# Patient Record
Sex: Female | Born: 1935 | Race: White | Hispanic: No
Health system: Southern US, Community
[De-identification: ages and names within clinical notes are randomized; demographics above are authoritative.]

## PROBLEM LIST (undated history)

## (undated) ENCOUNTER — Emergency Department (HOSPITAL_COMMUNITY): Payer: Self-pay

## (undated) DIAGNOSIS — N200 Calculus of kidney: Secondary | ICD-10-CM

## (undated) HISTORY — PX: TONSILLECTOMY: SUR1361

## (undated) HISTORY — PX: OTHER SURGICAL HISTORY: SHX169

---

## 1998-01-27 ENCOUNTER — Other Ambulatory Visit: Admission: RE | Admit: 1998-01-27 | Discharge: 1998-01-27 | Payer: Self-pay | Admitting: Family Medicine

## 1999-01-20 ENCOUNTER — Other Ambulatory Visit: Admission: RE | Admit: 1999-01-20 | Discharge: 1999-01-20 | Payer: Self-pay | Admitting: Family Medicine

## 1999-08-14 ENCOUNTER — Encounter: Admission: RE | Admit: 1999-08-14 | Discharge: 1999-08-14 | Payer: Self-pay | Admitting: Family Medicine

## 1999-08-14 ENCOUNTER — Encounter: Payer: Self-pay | Admitting: Family Medicine

## 2000-01-24 ENCOUNTER — Other Ambulatory Visit: Admission: RE | Admit: 2000-01-24 | Discharge: 2000-01-24 | Payer: Self-pay | Admitting: Family Medicine

## 2000-08-26 ENCOUNTER — Encounter: Payer: Self-pay | Admitting: Family Medicine

## 2000-08-26 ENCOUNTER — Encounter: Admission: RE | Admit: 2000-08-26 | Discharge: 2000-08-26 | Payer: Self-pay | Admitting: Family Medicine

## 2001-09-10 ENCOUNTER — Encounter: Payer: Self-pay | Admitting: Family Medicine

## 2001-09-10 ENCOUNTER — Encounter: Admission: RE | Admit: 2001-09-10 | Discharge: 2001-09-10 | Payer: Self-pay | Admitting: Family Medicine

## 2002-09-24 ENCOUNTER — Encounter: Admission: RE | Admit: 2002-09-24 | Discharge: 2002-09-24 | Payer: Self-pay | Admitting: Orthopedic Surgery

## 2002-09-24 ENCOUNTER — Encounter: Payer: Self-pay | Admitting: Orthopedic Surgery

## 2004-01-12 ENCOUNTER — Ambulatory Visit (HOSPITAL_COMMUNITY): Admission: RE | Admit: 2004-01-12 | Discharge: 2004-01-12 | Payer: Self-pay | Admitting: Family Medicine

## 2005-02-15 ENCOUNTER — Ambulatory Visit (HOSPITAL_COMMUNITY): Admission: RE | Admit: 2005-02-15 | Discharge: 2005-02-15 | Payer: Self-pay | Admitting: Family Medicine

## 2006-02-21 ENCOUNTER — Ambulatory Visit (HOSPITAL_COMMUNITY): Admission: RE | Admit: 2006-02-21 | Discharge: 2006-02-21 | Payer: Self-pay | Admitting: Family Medicine

## 2007-04-23 ENCOUNTER — Ambulatory Visit (HOSPITAL_COMMUNITY): Admission: RE | Admit: 2007-04-23 | Discharge: 2007-04-23 | Payer: Self-pay | Admitting: Family Medicine

## 2008-04-28 ENCOUNTER — Ambulatory Visit (HOSPITAL_COMMUNITY): Admission: RE | Admit: 2008-04-28 | Discharge: 2008-04-28 | Payer: Self-pay | Admitting: Family Medicine

## 2009-05-30 ENCOUNTER — Ambulatory Visit (HOSPITAL_COMMUNITY): Admission: RE | Admit: 2009-05-30 | Discharge: 2009-05-30 | Payer: Self-pay | Admitting: Family Medicine

## 2010-06-08 ENCOUNTER — Ambulatory Visit (HOSPITAL_COMMUNITY): Admission: RE | Admit: 2010-06-08 | Discharge: 2010-06-08 | Payer: Self-pay | Admitting: Family Medicine

## 2010-08-27 ENCOUNTER — Encounter: Payer: Self-pay | Admitting: Family Medicine

## 2011-05-23 ENCOUNTER — Other Ambulatory Visit (HOSPITAL_COMMUNITY): Payer: Self-pay | Admitting: Family Medicine

## 2011-05-23 DIAGNOSIS — Z1231 Encounter for screening mammogram for malignant neoplasm of breast: Secondary | ICD-10-CM

## 2011-06-13 ENCOUNTER — Ambulatory Visit (HOSPITAL_COMMUNITY)
Admission: RE | Admit: 2011-06-13 | Discharge: 2011-06-13 | Disposition: A | Discharge status: Home / Self Care | Payer: Medicare Other | Source: Ambulatory Visit | Attending: Family Medicine | Admitting: Family Medicine

## 2011-06-13 DIAGNOSIS — Z1231 Encounter for screening mammogram for malignant neoplasm of breast: Secondary | ICD-10-CM | POA: Insufficient documentation

## 2011-06-25 ENCOUNTER — Other Ambulatory Visit: Payer: Self-pay | Admitting: Family Medicine

## 2011-06-25 DIAGNOSIS — R928 Other abnormal and inconclusive findings on diagnostic imaging of breast: Secondary | ICD-10-CM

## 2011-07-09 ENCOUNTER — Ambulatory Visit
Admission: RE | Admit: 2011-07-09 | Discharge: 2011-07-09 | Disposition: A | Discharge status: Home / Self Care | Payer: Medicare Other | Source: Ambulatory Visit | Attending: Family Medicine | Admitting: Family Medicine

## 2011-07-09 DIAGNOSIS — R928 Other abnormal and inconclusive findings on diagnostic imaging of breast: Secondary | ICD-10-CM

## 2012-06-19 ENCOUNTER — Other Ambulatory Visit (HOSPITAL_COMMUNITY): Payer: Self-pay | Admitting: Family Medicine

## 2012-06-19 DIAGNOSIS — Z1231 Encounter for screening mammogram for malignant neoplasm of breast: Secondary | ICD-10-CM

## 2012-06-20 ENCOUNTER — Other Ambulatory Visit (HOSPITAL_COMMUNITY): Payer: Self-pay | Admitting: Family Medicine

## 2012-06-20 DIAGNOSIS — M810 Age-related osteoporosis without current pathological fracture: Secondary | ICD-10-CM

## 2012-07-11 ENCOUNTER — Ambulatory Visit (HOSPITAL_COMMUNITY)
Admission: RE | Admit: 2012-07-11 | Discharge: 2012-07-11 | Disposition: A | Discharge status: Home / Self Care | Payer: Medicare Other | Source: Ambulatory Visit | Attending: Family Medicine | Admitting: Family Medicine

## 2012-07-11 DIAGNOSIS — Z13820 Encounter for screening for osteoporosis: Secondary | ICD-10-CM | POA: Insufficient documentation

## 2012-07-11 DIAGNOSIS — Z1231 Encounter for screening mammogram for malignant neoplasm of breast: Secondary | ICD-10-CM

## 2012-07-11 DIAGNOSIS — M810 Age-related osteoporosis without current pathological fracture: Secondary | ICD-10-CM

## 2012-07-11 DIAGNOSIS — Z780 Asymptomatic menopausal state: Secondary | ICD-10-CM | POA: Insufficient documentation

## 2013-02-11 ENCOUNTER — Emergency Department (HOSPITAL_COMMUNITY): Payer: Medicare Other

## 2013-02-11 ENCOUNTER — Encounter (HOSPITAL_COMMUNITY): Payer: Self-pay | Admitting: Emergency Medicine

## 2013-02-11 ENCOUNTER — Emergency Department (HOSPITAL_COMMUNITY)
Admission: EM | Admit: 2013-02-11 | Discharge: 2013-02-11 | Disposition: A | Discharge status: Home / Self Care | Payer: Medicare Other | Attending: Emergency Medicine | Admitting: Emergency Medicine

## 2013-02-11 DIAGNOSIS — R109 Unspecified abdominal pain: Secondary | ICD-10-CM

## 2013-02-11 DIAGNOSIS — R1033 Periumbilical pain: Secondary | ICD-10-CM | POA: Insufficient documentation

## 2013-02-11 DIAGNOSIS — R000 Tachycardia, unspecified: Secondary | ICD-10-CM | POA: Insufficient documentation

## 2013-02-11 DIAGNOSIS — Z87442 Personal history of urinary calculi: Secondary | ICD-10-CM | POA: Insufficient documentation

## 2013-02-11 HISTORY — DX: Calculus of kidney: N20.0

## 2013-02-11 LAB — URINALYSIS, ROUTINE W REFLEX MICROSCOPIC
Bilirubin Urine: NEGATIVE
Glucose, UA: NEGATIVE mg/dL
Hgb urine dipstick: NEGATIVE
Ketones, ur: NEGATIVE mg/dL
Nitrite: NEGATIVE
Protein, ur: NEGATIVE mg/dL
Specific Gravity, Urine: 1.019 (ref 1.005–1.030)
pH: 7 (ref 5.0–8.0)

## 2013-02-11 LAB — CBC WITH DIFFERENTIAL/PLATELET
Basophils Relative: 0 % (ref 0–1)
Eosinophils Absolute: 0 10*3/uL (ref 0.0–0.7)
Lymphs Abs: 0.9 10*3/uL (ref 0.7–4.0)
MCV: 92.1 fL (ref 78.0–100.0)
Monocytes Relative: 5 % (ref 3–12)
Neutrophils Relative %: 86 % — ABNORMAL HIGH (ref 43–77)
Platelets: 187 10*3/uL (ref 150–400)
WBC: 9.6 10*3/uL (ref 4.0–10.5)

## 2013-02-11 LAB — URINE MICROSCOPIC-ADD ON

## 2013-02-11 LAB — COMPREHENSIVE METABOLIC PANEL
CO2: 26 mEq/L (ref 19–32)
Calcium: 9.4 mg/dL (ref 8.4–10.5)
Creatinine, Ser: 0.69 mg/dL (ref 0.50–1.10)
GFR calc non Af Amer: 82 mL/min — ABNORMAL LOW (ref >90)
Glucose, Bld: 203 mg/dL — ABNORMAL HIGH (ref 70–99)
Potassium: 3.9 mEq/L (ref 3.5–5.1)
Total Protein: 7.3 g/dL (ref 6.0–8.3)

## 2013-02-11 MED ORDER — ONDANSETRON HCL 4 MG/2ML IJ SOLN
4.0000 mg | Freq: Once | INTRAMUSCULAR | Status: AC
  Administered 2013-02-11: 4 mg via INTRAVENOUS
  Filled 2013-02-11: qty 2

## 2013-02-11 MED ORDER — POLYETHYLENE GLYCOL 3350 17 GM/SCOOP PO POWD: 17.0000 g | Freq: Every day | ORAL | Status: DC

## 2013-02-11 MED ORDER — IOHEXOL 300 MG/ML  SOLN
50.0000 mL | Freq: Once | INTRAMUSCULAR | Status: AC | PRN
  Administered 2013-02-11: 50 mL via ORAL

## 2013-02-11 MED ORDER — IOHEXOL 300 MG/ML  SOLN
100.0000 mL | Freq: Once | INTRAMUSCULAR | Status: AC | PRN
  Administered 2013-02-11: 100 mL via INTRAVENOUS

## 2013-02-11 MED ORDER — OXYCODONE-ACETAMINOPHEN 5-325 MG PO TABS: 1.0000 | ORAL_TABLET | Freq: Four times a day (QID) | ORAL | Status: DC | PRN

## 2013-02-11 MED ORDER — MORPHINE SULFATE 4 MG/ML IJ SOLN
4.0000 mg | Freq: Once | INTRAMUSCULAR | Status: AC
  Administered 2013-02-11: 4 mg via INTRAVENOUS
  Filled 2013-02-11: qty 1

## 2013-02-11 NOTE — ED Notes (Signed)
Pt reports abdominal pain since 1930 last night.  Pt denies n/v/d. Pt stated she ate fried okra and cantaloupe and she hasn't had these regularly before.  Pt stated pain intermittent, 2/10 at this time.  Pt ambulatory in ED, NAD noted at this time.

## 2013-02-11 NOTE — ED Notes (Signed)
MD at bedside speaking to pt and family member 

## 2013-02-11 NOTE — ED Provider Notes (Signed)
History    CSN: [id number] Arrival date & time 02/11/13  7829  First MD Initiated Contact with Patient 02/11/13 [phone]     Chief Complaint  Patient presents with  . Abdominal Pain   (Consider location/radiation/quality/duration/timing/severity/associated sxs/prior Treatment) HPI Comments: Patient states around 4:30 yesterday she started to have some mild periumbilical abdominal pain. Her around 7 PM the pain became severe a 10 out of 10. It persisted throughout the night and she was unable to get comfortable. She had 2 normal bowel movements during the pain without improvement.  She denies any nausea or vomiting. No recent medication changes and denies any urinary changes or pain with urination. She had a kidney stone in the past but states this feels nothing like a kidney stone.  Currently pain is 2/10 and she is feeling much better.  Patient is a 78 y.o. female presenting with abdominal pain. The history is provided by the patient.  Abdominal Pain This is a new problem. The current episode started 12 to 24 hours ago. The problem occurs constantly. The problem has been gradually improving. Associated symptoms include abdominal pain. Pertinent negatives include no shortness of breath. The symptoms are aggravated by eating. Nothing relieves the symptoms. She has tried acetaminophen for the symptoms. The treatment provided no relief.   Past Medical History  Diagnosis Date  . Kidney stones    Past Surgical History  Procedure Laterality Date  . Kidney stone removal     History reviewed. No pertinent family history. History  Substance Use Topics  . Smoking status: Never Smoker   . Smokeless tobacco: Never Used  . Alcohol Use: No   OB History   Grav Para Term Preterm Abortions TAB SAB Ect Mult Living                 Review of Systems  Constitutional: Negative for fever.  Respiratory: Negative for cough, shortness of breath and wheezing.   Cardiovascular: Negative for leg swelling.    Gastrointestinal: Positive for abdominal pain. Negative for nausea, vomiting and diarrhea.  Genitourinary: Negative for dysuria.  All other systems reviewed and are negative.    Allergies  Lemon flavor  Home Medications  No current outpatient prescriptions on file. BP 157/86  Pulse 111  Temp(Src) 98.4 F (36.9 C) (Oral)  Resp 16  Ht 5\' 2"  (1.575 m)  Wt 120 lb (54.432 kg)  BMI 21.94 kg/m2  SpO2 97% Physical Exam  Nursing note and vitals reviewed. Constitutional: She is oriented to person, place, and time. She appears well-developed and well-nourished. No distress.  HENT:  Head: Normocephalic and atraumatic.  Mouth/Throat: Oropharynx is clear and moist.  Eyes: Conjunctivae and EOM are normal. Pupils are equal, round, and reactive to light.  Neck: Normal range of motion. Neck supple.  Cardiovascular: Regular rhythm and intact distal pulses.  Tachycardia present.   No murmur heard. Pulmonary/Chest: Effort normal and breath sounds normal. No respiratory distress. She has no wheezes. She has no rales.  Abdominal: Soft. Normal appearance. She exhibits no distension. There is tenderness in the periumbilical area. There is no rebound, no guarding and no CVA tenderness. No hernia.    Musculoskeletal: Normal range of motion. She exhibits no edema and no tenderness.  Neurological: She is alert and oriented to person, place, and time.  Skin: Skin is warm and dry. No rash noted. No erythema.  Psychiatric: She has a normal mood and affect. Her behavior is normal.    ED Course  Procedures (including  critical care time) Labs Reviewed  CBC WITH DIFFERENTIAL  COMPREHENSIVE METABOLIC PANEL  LIPASE, BLOOD  URINALYSIS, ROUTINE W REFLEX MICROSCOPIC   No results found. No diagnosis found.  MDM   Patient with abdominal pain that started approximately 12 hours ago and waxed and waned in severity. Currently pain is a 2 and has not seemed to have exacerbating factors. She has mild  tenderness in the left umbilical region but no signs of hernia. Patient does not have classic symptoms of cholecystitis but concern for possible atypical appendicitis versus diverticulitis.  She denies any nausea or vomiting and has had 2 normal bowel movements since the pain started which did not alleviate the pain.  Patient at this point does not want any pain medicine.  CBC, CMP, lipase, UA pending.  Patient will most likely need a CT for further evaluation  Gwyneth Sprout, MD 02/15/13 1550

## 2013-02-11 NOTE — ED Provider Notes (Signed)
CT scan is reassuring. Likely ovarian cyst will need to get followed. Patient was informed. Patient was given pain medicines and laxatives. She'll be discharged home. Minimal tenderness on exam and pain has improved  Harrold Donath R. Rubin Payor, MD 02/11/13 0900

## 2013-02-12 ENCOUNTER — Other Ambulatory Visit: Payer: Self-pay | Admitting: Family Medicine

## 2013-02-12 DIAGNOSIS — N83209 Unspecified ovarian cyst, unspecified side: Secondary | ICD-10-CM

## 2013-02-18 ENCOUNTER — Ambulatory Visit
Admission: RE | Admit: 2013-02-18 | Discharge: 2013-02-18 | Disposition: A | Discharge status: Home / Self Care | Payer: Medicare Other | Source: Ambulatory Visit | Attending: Family Medicine | Admitting: Family Medicine

## 2013-02-18 DIAGNOSIS — N83209 Unspecified ovarian cyst, unspecified side: Secondary | ICD-10-CM

## 2013-03-27 ENCOUNTER — Other Ambulatory Visit: Payer: Self-pay | Admitting: Obstetrics and Gynecology

## 2013-03-30 ENCOUNTER — Encounter (HOSPITAL_COMMUNITY): Payer: Self-pay

## 2013-03-31 ENCOUNTER — Encounter: Payer: Self-pay | Admitting: Gynecologic Oncology

## 2013-03-31 ENCOUNTER — Ambulatory Visit: Payer: Medicare Other | Attending: Gynecologic Oncology | Admitting: Gynecologic Oncology

## 2013-03-31 VITALS — BP 122/80 | HR 92 | Temp 97.8°F | Resp 16 | Ht 62.01 in | Wt 118.3 lb

## 2013-03-31 DIAGNOSIS — R634 Abnormal weight loss: Secondary | ICD-10-CM | POA: Insufficient documentation

## 2013-03-31 DIAGNOSIS — N83209 Unspecified ovarian cyst, unspecified side: Secondary | ICD-10-CM

## 2013-03-31 NOTE — Patient Instructions (Signed)
Follow-up with Dr. Billy Coast.   Thank you very much Tracy Bradford for allowing me to provide care for you today.  I appreciate your confidence in choosing our Gynecologic Oncology team.  If you have any questions about your visit today please call our office and we will get back to you as soon as possible.  Tracy Bradford. Leonardo Plaia MD., PhD Gynecologic Oncology

## 2013-03-31 NOTE — Progress Notes (Signed)
Consult Note: Gyn-Onc  Consult was requested by Dr. Billy Coast for the evaluation of Tracy Bradford 78 y.o. female  CC:  Chief Complaint  Patient presents with  . Ovarian Cyst    New Consult    Assessment/Plan: Tracy Bradford  is a 78 year old with a 4.5 cm right adnexal mass that an ultrasound is noted to be complex cystic with a solid lesion. CA 125 is within normal limits. There is no evidence of free fluid or any stigmata of a metastatic all ovarian process. She presents with no complaints of her worry some of that process either. The plan as patient informs me is that Dr. Rosemary Holms has recommended laparoscopic bilateral salpingo-oophorectomy.  This procedure is scheduled for 04/13/2013. I shared that there is increased in agreement with this recommendation and that would be the procedure would recommend myself. I informed her that if the intraoperative her final pathology findings demonstrate a malignant process we be available to make recommendations. All of her questions were answered to her satisfaction.   HPI: Tracy Bradford  is a 78 y.o. gravida 2 para 2 who presented to emergency room 02/11/2013 with significant right lower quadrant pain. Imaging was performed at that time was notable for a bilobed low-attenuation lesion in the right anatomic pelvis. The mass was noted to be calcified and measures approximately 2.4 x 5.0 x 4.7 cm there is no free fluid no pathologically enlarged lymph nodes were noted. A pelvic ultrasound on 02/18/2013 and was notable for an endometrium measuring 2 mm. Uterus was 2.6 x 5.2 cm and retroverted. The right ovary was noted to have a complex cystic and solid lesion with peripheral calcification. The mass measured 4.5 x 2.5 x 4.2 cm no definite internal fat was seen. Left ovary was noted to measure 3.1 x 1.51-2 cm was normal in appearance. The CA 125  collected on 02/23/2013 is 13.  Patient states that they had a right lower quadrant pain became abdominal diffusely in nature.  It was not associated with nausea or vomiting. Pain has not recurred since that one episode in July. She reports intentional 10 pound weight loss over the last 2 years she denies fatigability abdominal bloating changes in her bowel or rectal habits or early satiety.   Current Meds:  Outpatient Encounter Prescriptions as of 03/31/2013  Medication Sig Dispense Refill  . acetaminophen (TYLENOL) 325 MG tablet Take 325-650 mg by mouth daily as needed for pain.       No facility-administered encounter medications on file as of 03/31/2013.    Allergy:  Allergies  Allergen Reactions  . Lemon Flavor Nausea And Vomiting    Lemons     Social Hx:   History   Social History  . Marital Status: Married    Spouse Name: N/A    Number of Children: N/A  . Years of Education: N/A   Occupational History  . Not on file.   Social History Main Topics  . Smoking status: Never Smoker   . Smokeless tobacco: Never Used  . Alcohol Use: No  . Drug Use: No  . Sexual Activity: Not on file   Other Topics Concern  . Not on file   Social History Narrative  . No narrative on file   very active height 5-1/4 miles last weekend with her husband  Past Surgical Hx:  Past Surgical History  Procedure Laterality Date  . Kidney stone removal      Past Medical Hx:  Past Medical History  Diagnosis Date  . Kidney stones     Past Gynecological History: Gravida 2 para 2 menarche at the age of 51 with regular menses until the age of 69 history of IUD and OCP use in her reproductive years  Family Hx: No family history on file.  Review of Systems:  Constitutional  Feels well,  denies fatigue early satiety intentional 10 pound weight loss over the last 2 years no fatigue Skin/Breast  No rash, sores, jaundice, itching, dryness Cardiovascular  No chest pain, shortness of breath, or edema  Pulmonary  No cough or wheeze.  Gastro Intestinal  No nausea, vomitting, or diarrhoea. No bright red blood per  rectum, no abdominal pain, change in bowel movement, or constipation. No abnormal bloating Genito Urinary  No frequency, urgency, dysuria, no vaginal bleeding or discharge Musculo Skeletal  No myalgia, arthralgia, joint swelling or pain  Neurologic  No weakness, numbness, change in gait,  Psychology  No depression, anxiety, insomnia.   Vitals:  Blood pressure 122/80, pulse 92, temperature 97.8 F (36.6 C), temperature source Oral, resp. rate 16, height 5' 2.01" (1.575 m), weight 118 lb 4.8 oz (53.661 kg).  Physical Exam: WD in NAD Neck  Supple NROM, without any enlargements.  Lymph Node Survey No cervical supraclavicular or inguinal adenopathy Cardiovascular  Pulse normal rate, regularity and rhythm. Lungs  Clear to auscultation bilateraly, without wheezes/crackles/rhonchi. Good air movement.  Skin  No rash/lesions/breakdown  Psychiatry  Alert and oriented to person, place, and time  Abdomen  Normoactive bowel sounds, abdomen soft, non-tender . Back No CVA tenderness Genito Urinary  Vulva/vagina: Normal external female genitalia.  No lesions. No discharge or bleeding.  Bladder/urethra:  No lesions or masses  Vagina: atrophic no masses  Cervix: Normal appearing, no lesions.  Uterus: Small, mobile, no parametrial involvement or nodularity.  Adnexa: No palpable masses. Rectal  Good tone, no masses no cul de sac nodularity.  Extremities  No bilateral cyanosis, clubbing or edema.   Laurette Schimke, MD, PhD 03/31/2013, 3:57 PM

## 2013-04-02 NOTE — Patient Instructions (Addendum)
Your procedure is scheduled on: 04/13/2013  Enter through the Main Entrance of [facility] at: [id number]  Pick up the phone at the desk and dial [phone].  Call this number if you have problems the morning of surgery: [phone].  Remember: Do NOT eat food:AFTER MIDNIGHT 04/12/2013 Do NOT drink clear liquids after:AFTER MIDNIGHT 04/12/2013   Do NOT wear jewelry, make-up, or nail polish. Do NOT wear lotions, powders, or perfumes.  You may wear deodorant. Do NOT shave for 48 hours prior to surgery. Do NOT bring valuables to the hospital. Contacts, dentures, or bridgework may not be worn into surgery. Leave suitcase in car.  After surgery it may be brought to your room.  For patients admitted to the hospital, checkout time is 11:00 AM the day of discharge.

## 2013-04-07 ENCOUNTER — Encounter (HOSPITAL_COMMUNITY): Payer: Self-pay

## 2013-04-07 ENCOUNTER — Encounter (HOSPITAL_COMMUNITY)
Admission: RE | Admit: 2013-04-07 | Discharge: 2013-04-07 | Disposition: A | Discharge status: Home / Self Care | Payer: Medicare Other | Source: Ambulatory Visit | Attending: Obstetrics and Gynecology | Admitting: Obstetrics and Gynecology

## 2013-04-07 DIAGNOSIS — Z01810 Encounter for preprocedural cardiovascular examination: Secondary | ICD-10-CM | POA: Insufficient documentation

## 2013-04-07 DIAGNOSIS — Z01818 Encounter for other preprocedural examination: Secondary | ICD-10-CM | POA: Insufficient documentation

## 2013-04-07 DIAGNOSIS — Z01812 Encounter for preprocedural laboratory examination: Secondary | ICD-10-CM | POA: Insufficient documentation

## 2013-04-07 LAB — CBC
MCH: 31 pg (ref 26.0–34.0)
MCHC: 33.3 g/dL (ref 30.0–36.0)
MCV: 93.2 fL (ref 78.0–100.0)
Platelets: 198 10*3/uL (ref 150–400)
RDW: 13.6 % (ref 11.5–15.5)

## 2013-04-07 LAB — COMPREHENSIVE METABOLIC PANEL
AST: 22 U/L (ref 0–37)
CO2: 29 mEq/L (ref 19–32)
Calcium: 9.7 mg/dL (ref 8.4–10.5)
Creatinine, Ser: 0.73 mg/dL (ref 0.50–1.10)
GFR calc non Af Amer: 81 mL/min — ABNORMAL LOW (ref >90)
Sodium: 139 mEq/L (ref 135–145)
Total Protein: 7.5 g/dL (ref 6.0–8.3)

## 2013-04-12 NOTE — H&P (Signed)
Tracy Bradford, Tracy Bradford                   ACCOUNT NO.:  [id number]  MEDICAL RECORD NO.:  [id number]  LOCATION:  [facility]                         FACILITY:  [facility]  PHYSICIAN:  Lenoard Aden, M.D.DATE OF BIRTH:  [birth year]  DATE OF ADMISSION:  03/19/2013 DATE OF DISCHARGE:                             HISTORY & PHYSICAL   CHIEF COMPLAINT:  Symptomatic right ovarian cyst.  HISTORY OF PRESENT ILLNESS:  The patient is a 78 year old white female, G2, P2, with a history of 5-cm complex right ovarian cyst, who presents for surgical intervention.  ALLERGIES:  She has no known drug allergies.  MEDICATIONS:  None.  SOCIAL HISTORY:  She is a nonsmoker, nondrinker.  Denies domestic or physical violence.  History of vaginal delivery x2.  SURGICAL HISTORY:  For kidney stone removal in 1972 and bilateral cataract surgery.  FAMILY HISTORY:  Breast cancer.  Previous medical course noted for a symptomatic right adnexal mass, complex in nature with negative free fluid noted, negative ovarian tumor markers, and history of a GYN Oncology note suggesting minimal invasive surgery as a possible intervention.  PHYSICAL EXAMINATION:  GENERAL:  She is a well-developed, well-nourished white female, in no acute distress. VITAL SIGNS:  Height of 62 inches, weight of 118 pounds. HEENT:  Normal. NECK:  Supple.  Full range of motion. LUNGS:  Clear. HEART:  Regular rhythm. ABDOMEN:  Soft, nontender. PELVIC:  Revealed right adnexal fullness with a small retroflexed uterus and no adnexal masses. EXTREMITIES:  There are no cords. NEUROLOGIC:  Nonfocal. SKIN:  Intact.  IMPRESSION:  Symptomatic right ovarian cyst with negative tumor markers, negative free fluid, no evidence of malignancy, confirmatory GYN Oncology consult for minimal invasive approach.  PLAN:  Proceed with da Vinci-assisted laparoscopic bilateral salpingo- oophorectomy, fluid for pelvic washings to be collected.  Risks of anesthesia,  infection, bleeding, injury to surrounding organs, need for repair is discussed, delayed versus immediate complications include bowel and bladder injury noted, inability to prevent malignancy, or diagnose malignancy within the 100% certainty prior surgical intervention discussed, the patient acknowledges and wishes to proceed.     Lenoard Aden, M.D.     RJT/MEDQ  D:  04/12/2013  T:  04/12/2013  Job:  [id number]

## 2013-04-13 ENCOUNTER — Ambulatory Visit (HOSPITAL_COMMUNITY)
Admission: RE | Admit: 2013-04-13 | Discharge: 2013-04-13 | Disposition: A | Discharge status: Home / Self Care | Payer: Medicare Other | Source: Ambulatory Visit | Attending: Obstetrics and Gynecology | Admitting: Obstetrics and Gynecology

## 2013-04-13 ENCOUNTER — Encounter (HOSPITAL_COMMUNITY)
Admission: RE | Disposition: A | Discharge status: Home / Self Care | Payer: Self-pay | Source: Ambulatory Visit | Attending: Obstetrics and Gynecology

## 2013-04-13 ENCOUNTER — Ambulatory Visit: Admit: 2013-04-13 | Payer: Self-pay | Admitting: Obstetrics and Gynecology

## 2013-04-13 ENCOUNTER — Encounter (HOSPITAL_COMMUNITY): Payer: Self-pay | Admitting: Anesthesiology

## 2013-04-13 ENCOUNTER — Ambulatory Visit (HOSPITAL_COMMUNITY): Payer: Medicare Other | Admitting: Anesthesiology

## 2013-04-13 DIAGNOSIS — D260 Other benign neoplasm of cervix uteri: Secondary | ICD-10-CM | POA: Insufficient documentation

## 2013-04-13 DIAGNOSIS — N736 Female pelvic peritoneal adhesions (postinfective): Secondary | ICD-10-CM | POA: Insufficient documentation

## 2013-04-13 DIAGNOSIS — N854 Malposition of uterus: Secondary | ICD-10-CM | POA: Insufficient documentation

## 2013-04-13 DIAGNOSIS — D259 Leiomyoma of uterus, unspecified: Secondary | ICD-10-CM | POA: Insufficient documentation

## 2013-04-13 DIAGNOSIS — N83209 Unspecified ovarian cyst, unspecified side: Secondary | ICD-10-CM | POA: Insufficient documentation

## 2013-04-13 HISTORY — PX: ROBOTIC ASSISTED BILATERAL SALPINGO OOPHERECTOMY: SHX6078

## 2013-04-13 LAB — GLUCOSE, CAPILLARY: Glucose-Capillary: 114 mg/dL — ABNORMAL HIGH (ref 70–99)

## 2013-04-13 SURGERY — ROBOTIC ASSISTED BILATERAL SALPINGO OOPHORECTOMY
Anesthesia: General | Site: Abdomen | Wound class: Clean Contaminated

## 2013-04-13 SURGERY — ROBOTIC ASSISTED BILATERAL SALPINGO OOPHORECTOMY
Anesthesia: General | Laterality: Bilateral

## 2013-04-13 MED ORDER — GLYCOPYRROLATE 0.2 MG/ML IJ SOLN
INTRAMUSCULAR | Status: DC | PRN
  Administered 2013-04-13: 0.6 mg via INTRAVENOUS

## 2013-04-13 MED ORDER — EPHEDRINE 5 MG/ML INJ
INTRAVENOUS | Status: AC
  Filled 2013-04-13: qty 10

## 2013-04-13 MED ORDER — TRAMADOL HCL 50 MG PO TABS: 50.0000 mg | ORAL_TABLET | Freq: Four times a day (QID) | ORAL | Status: DC | PRN

## 2013-04-13 MED ORDER — LIDOCAINE HCL (CARDIAC) 20 MG/ML IV SOLN
INTRAVENOUS | Status: AC
  Filled 2013-04-13: qty 5

## 2013-04-13 MED ORDER — ROCURONIUM BROMIDE 100 MG/10ML IV SOLN
INTRAVENOUS | Status: DC | PRN
  Administered 2013-04-13: 5 mg via INTRAVENOUS
  Administered 2013-04-13 (×2): 10 mg via INTRAVENOUS
  Administered 2013-04-13: 45 mg via INTRAVENOUS

## 2013-04-13 MED ORDER — KETOROLAC TROMETHAMINE 30 MG/ML IJ SOLN
INTRAMUSCULAR | Status: AC
  Filled 2013-04-13: qty 1

## 2013-04-13 MED ORDER — ONDANSETRON HCL 4 MG/2ML IJ SOLN
INTRAMUSCULAR | Status: DC | PRN
  Administered 2013-04-13: 4 mg via INTRAVENOUS

## 2013-04-13 MED ORDER — INDIGOTINDISULFONATE SODIUM 8 MG/ML IJ SOLN
INTRAMUSCULAR | Status: AC
  Filled 2013-04-13: qty 5

## 2013-04-13 MED ORDER — DEXAMETHASONE SODIUM PHOSPHATE 10 MG/ML IJ SOLN
INTRAMUSCULAR | Status: AC
  Filled 2013-04-13: qty 1

## 2013-04-13 MED ORDER — CEFAZOLIN SODIUM-DEXTROSE 2-3 GM-% IV SOLR
2.0000 g | INTRAVENOUS | Status: AC
  Administered 2013-04-13: 2 g via INTRAVENOUS

## 2013-04-13 MED ORDER — FENTANYL CITRATE 0.05 MG/ML IJ SOLN: 25.0000 ug | INTRAMUSCULAR | Status: DC | PRN

## 2013-04-13 MED ORDER — OXYCODONE-ACETAMINOPHEN 2.5-325 MG PO TABS: 1.0000 | ORAL_TABLET | ORAL | Status: DC | PRN

## 2013-04-13 MED ORDER — PHENYLEPHRINE 40 MCG/ML (10ML) SYRINGE FOR IV PUSH (FOR BLOOD PRESSURE SUPPORT)
PREFILLED_SYRINGE | INTRAVENOUS | Status: AC
  Filled 2013-04-13: qty 10

## 2013-04-13 MED ORDER — SODIUM CHLORIDE 0.9 % IJ SOLN
INTRAMUSCULAR | Status: DC | PRN
  Administered 2013-04-13: 10 mL

## 2013-04-13 MED ORDER — ROCURONIUM BROMIDE 50 MG/5ML IV SOLN
INTRAVENOUS | Status: AC
  Filled 2013-04-13: qty 1

## 2013-04-13 MED ORDER — NEOSTIGMINE METHYLSULFATE 1 MG/ML IJ SOLN
INTRAMUSCULAR | Status: DC | PRN
  Administered 2013-04-13: 3 mg via INTRAVENOUS

## 2013-04-13 MED ORDER — MIDAZOLAM HCL 2 MG/2ML IJ SOLN
INTRAMUSCULAR | Status: AC
  Filled 2013-04-13: qty 2

## 2013-04-13 MED ORDER — BUPIVACAINE HCL (PF) 0.25 % IJ SOLN
INTRAMUSCULAR | Status: DC | PRN
  Administered 2013-04-13: 15 mL

## 2013-04-13 MED ORDER — PHENYLEPHRINE HCL 10 MG/ML IJ SOLN
INTRAMUSCULAR | Status: DC | PRN
  Administered 2013-04-13 (×2): 40 ug via INTRAVENOUS
  Administered 2013-04-13: 80 ug via INTRAVENOUS
  Administered 2013-04-13 (×4): 40 ug via INTRAVENOUS

## 2013-04-13 MED ORDER — PROPOFOL 10 MG/ML IV BOLUS
INTRAVENOUS | Status: DC | PRN
  Administered 2013-04-13: 110 mg via INTRAVENOUS

## 2013-04-13 MED ORDER — BUPIVACAINE HCL (PF) 0.25 % IJ SOLN
INTRAMUSCULAR | Status: AC
  Filled 2013-04-13: qty 30

## 2013-04-13 MED ORDER — FENTANYL CITRATE 0.05 MG/ML IJ SOLN
INTRAMUSCULAR | Status: AC
  Filled 2013-04-13: qty 2

## 2013-04-13 MED ORDER — EPHEDRINE SULFATE 50 MG/ML IJ SOLN
INTRAMUSCULAR | Status: DC | PRN
  Administered 2013-04-13 (×5): 10 mg via INTRAVENOUS

## 2013-04-13 MED ORDER — FENTANYL CITRATE 0.05 MG/ML IJ SOLN
INTRAMUSCULAR | Status: DC | PRN
  Administered 2013-04-13 (×6): 50 ug via INTRAVENOUS

## 2013-04-13 MED ORDER — ONDANSETRON HCL 4 MG/2ML IJ SOLN
INTRAMUSCULAR | Status: AC
  Filled 2013-04-13: qty 2

## 2013-04-13 MED ORDER — ARTIFICIAL TEARS OP OINT
TOPICAL_OINTMENT | OPHTHALMIC | Status: AC
  Filled 2013-04-13: qty 3.5

## 2013-04-13 MED ORDER — PROPOFOL 10 MG/ML IV EMUL
INTRAVENOUS | Status: AC
  Filled 2013-04-13: qty 20

## 2013-04-13 MED ORDER — KETOROLAC TROMETHAMINE 30 MG/ML IJ SOLN
INTRAMUSCULAR | Status: DC | PRN
  Administered 2013-04-13: 30 mg via INTRAVENOUS

## 2013-04-13 MED ORDER — FENTANYL CITRATE 0.05 MG/ML IJ SOLN
INTRAMUSCULAR | Status: AC
  Filled 2013-04-13: qty 5

## 2013-04-13 MED ORDER — LACTATED RINGERS IV SOLN
INTRAVENOUS | Status: DC
  Administered 2013-04-13 (×2): via INTRAVENOUS
  Administered 2013-04-13: 50 mL/h via INTRAVENOUS
  Administered 2013-04-13 (×2): via INTRAVENOUS

## 2013-04-13 MED ORDER — MIDAZOLAM HCL 2 MG/2ML IJ SOLN
INTRAMUSCULAR | Status: DC | PRN
  Administered 2013-04-13: 1 mg via INTRAVENOUS

## 2013-04-13 MED ORDER — CEFAZOLIN SODIUM-DEXTROSE 2-3 GM-% IV SOLR
INTRAVENOUS | Status: AC
  Filled 2013-04-13: qty 50

## 2013-04-13 MED ORDER — LIDOCAINE HCL (CARDIAC) 20 MG/ML IV SOLN
INTRAVENOUS | Status: DC | PRN
  Administered 2013-04-13: 60 mg via INTRAVENOUS

## 2013-04-13 MED ORDER — PHENYLEPHRINE 40 MCG/ML (10ML) SYRINGE FOR IV PUSH (FOR BLOOD PRESSURE SUPPORT)
PREFILLED_SYRINGE | INTRAVENOUS | Status: AC
  Filled 2013-04-13: qty 5

## 2013-04-13 SURGICAL SUPPLY — 76 items
BARRIER ADHS 3X4 INTERCEED (GAUZE/BANDAGES/DRESSINGS) ×3 IMPLANT
CABLE HIGH FREQUENCY MONO STRZ (ELECTRODE) ×3 IMPLANT
CANNULA SEALS 8.5MM (CANNULA) ×1
CATH ROBINSON RED A/P 16FR (CATHETERS) IMPLANT
CLOTH BEACON ORANGE TIMEOUT ST (SAFETY) ×3 IMPLANT
CONT PATH 16OZ SNAP LID 3702 (MISCELLANEOUS) ×3 IMPLANT
COVER MAYO STAND STRL (DRAPES) ×3 IMPLANT
COVER TABLE BACK 60X90 (DRAPES) ×6 IMPLANT
COVER TIP SHEARS 8 DVNC (MISCELLANEOUS) ×2 IMPLANT
COVER TIP SHEARS 8MM DA VINCI (MISCELLANEOUS) ×1
DECANTER SPIKE VIAL GLASS SM (MISCELLANEOUS) ×3 IMPLANT
DERMABOND ADVANCED (GAUZE/BANDAGES/DRESSINGS) ×1
DERMABOND ADVANCED .7 DNX12 (GAUZE/BANDAGES/DRESSINGS) ×2 IMPLANT
DRAPE HUG U DISPOSABLE (DRAPE) ×3 IMPLANT
DRAPE LG THREE QUARTER DISP (DRAPES) ×6 IMPLANT
DRAPE WARM FLUID 44X44 (DRAPE) ×3 IMPLANT
ELECT REM PT RETURN 9FT ADLT (ELECTROSURGICAL) ×3
ELECTRODE REM PT RTRN 9FT ADLT (ELECTROSURGICAL) ×2 IMPLANT
EVACUATOR SMOKE 8.L (FILTER) ×3 IMPLANT
FORCEPS CUTTING 33CM 5MM (CUTTING FORCEPS) IMPLANT
FORCEPS CUTTING 45CM 5MM (CUTTING FORCEPS) IMPLANT
GAS CARTRIDGE (MEDICAL GASES) IMPLANT
GAUZE VASELINE 3X9 (GAUZE/BANDAGES/DRESSINGS) IMPLANT
GLOVE BIO SURGEON STRL SZ7.5 (GLOVE) ×3 IMPLANT
GOWN PREVENTION PLUS LG XLONG (DISPOSABLE) ×6 IMPLANT
GOWN PREVENTION PLUS XLARGE (GOWN DISPOSABLE) ×3 IMPLANT
GOWN STRL REIN XL XLG (GOWN DISPOSABLE) ×18 IMPLANT
GYRUS RUMI II 2.5CM BLUE (DISPOSABLE)
GYRUS RUMI II 3.5CM BLUE (DISPOSABLE)
GYRUS RUMI II 4.0CM BLUE (DISPOSABLE)
KIT ACCESSORY DA VINCI DISP (KITS) ×1
KIT ACCESSORY DVNC DISP (KITS) ×2 IMPLANT
LEGGING LITHOTOMY PAIR STRL (DRAPES) ×3 IMPLANT
NEEDLE INSUFFLATION 120MM (ENDOMECHANICALS) ×3 IMPLANT
NS IRRIG 1000ML POUR BTL (IV SOLUTION) ×9 IMPLANT
PACK LAPAROSCOPY BASIN (CUSTOM PROCEDURE TRAY) ×3 IMPLANT
PACK LAVH (CUSTOM PROCEDURE TRAY) ×3 IMPLANT
PAD PREP 24X48 CUFFED NSTRL (MISCELLANEOUS) ×6 IMPLANT
PORT ENDOSCOPE SS 8.5MM (MISCELLANEOUS) ×3 IMPLANT
PROTECTOR NERVE ULNAR (MISCELLANEOUS) ×6 IMPLANT
RUMI II 3.0CM BLUE KOH-EFFICIE (DISPOSABLE) IMPLANT
RUMI II GYRUS 2.5CM BLUE (DISPOSABLE) IMPLANT
RUMI II GYRUS 3.5CM BLUE (DISPOSABLE) IMPLANT
RUMI II GYRUS 4.0CM BLUE (DISPOSABLE) IMPLANT
SEAL CANN 8.5 DVNC (CANNULA) ×2 IMPLANT
SET CYSTO W/LG BORE CLAMP LF (SET/KITS/TRAYS/PACK) IMPLANT
SET IRRIG TUBING LAPAROSCOPIC (IRRIGATION / IRRIGATOR) ×3 IMPLANT
SOLUTION ELECTROLUBE (MISCELLANEOUS) ×3 IMPLANT
SUT VIC AB 0 CT1 27 (SUTURE)
SUT VIC AB 0 CT1 27XBRD ANTBC (SUTURE) IMPLANT
SUT VICRYL 0 UR6 27IN ABS (SUTURE) ×3 IMPLANT
SUT VICRYL 4-0 PS2 18IN ABS (SUTURE) ×6 IMPLANT
SUT VICRYL RAPIDE 4/0 PS 2 (SUTURE) IMPLANT
SYR 50ML LL SCALE MARK (SYRINGE) ×3 IMPLANT
SYRINGE 10CC LL (SYRINGE) ×3 IMPLANT
SYSTEM CONVERTIBLE TROCAR (TROCAR) IMPLANT
TAPE UMBILICAL 1/8 X36 TWILL (MISCELLANEOUS) IMPLANT
TIP UTERINE 5.1X6CM LAV DISP (MISCELLANEOUS) IMPLANT
TIP UTERINE 6.7X10CM GRN DISP (MISCELLANEOUS) IMPLANT
TIP UTERINE 6.7X6CM WHT DISP (MISCELLANEOUS) ×3 IMPLANT
TIP UTERINE 6.7X8CM BLUE DISP (MISCELLANEOUS) ×3 IMPLANT
TIP UTERINE MANIPULATOR 3.75CM (MISCELLANEOUS) ×3 IMPLANT
TOWEL OR 17X24 6PK STRL BLUE (TOWEL DISPOSABLE) ×9 IMPLANT
TRAY FOLEY BAG SILVER LF 14FR (CATHETERS) ×3 IMPLANT
TRAY FOLEY CATH 14FR (SET/KITS/TRAYS/PACK) ×3 IMPLANT
TROCAR BLADELESS OPT 12M 100M (ENDOMECHANICALS) ×3 IMPLANT
TROCAR DISP BLADELESS 8 DVNC (TROCAR) ×2 IMPLANT
TROCAR DISP BLADELESS 8MM (TROCAR) ×1
TROCAR OPTI TIP 5M 100M (ENDOMECHANICALS) ×3 IMPLANT
TROCAR XCEL 12X100 BLDLESS (ENDOMECHANICALS) ×3 IMPLANT
TROCAR XCEL DIL TIP R 11M (ENDOMECHANICALS) ×3 IMPLANT
TROCAR XCEL OPT SLVE 5M 100M (ENDOMECHANICALS) IMPLANT
TROCAR Z-THREAD 12X150 (TROCAR) ×3 IMPLANT
TUBING FILTER THERMOFLATOR (ELECTROSURGICAL) ×3 IMPLANT
WARMER LAPAROSCOPE (MISCELLANEOUS) ×3 IMPLANT
WATER STERILE IRR 1000ML POUR (IV SOLUTION) ×3 IMPLANT

## 2013-04-13 NOTE — Transfer of Care (Signed)
Immediate Anesthesia Transfer of Care Note  Patient: Tracy Bradford  Procedure(s) Performed: Procedure(s): ROBOTIC ASSISTED BILATERAL SALPINGO OOPHORECTOMY;Lysis of Bowel adhesions,myomectomy, removal of endocervical polyp (Bilateral)  Patient Location: PACU  Anesthesia Type:General  Level of Consciousness: awake, sedated and patient cooperative  Airway & Oxygen Therapy: Patient Spontanous Breathing and Patient connected to nasal cannula oxygen  Post-op Assessment: Report given to PACU RN and Post -op Vital signs reviewed and stable  Post vital signs: Reviewed and stable  Complications: No apparent anesthesia complications

## 2013-04-13 NOTE — Anesthesia Postprocedure Evaluation (Signed)
  Anesthesia Post-op Note  Anesthesia Post Note  Patient: Tracy Bradford  Procedure(s) Performed: Procedure(s) (LRB): ROBOTIC ASSISTED BILATERAL SALPINGO OOPHORECTOMY;Lysis of Bowel adhesions,myomectomy, removal of endocervical polyp (Bilateral)  Anesthesia type: General  Patient location: PACU  Post pain: Pain level controlled  Post assessment: Post-op Vital signs reviewed  Last Vitals:  Filed Vitals:   04/13/13 1330  BP: 149/75  Pulse: 95  Temp: 36.6 C  Resp: 15    Post vital signs: Reviewed  Level of consciousness: sedated  Complications: No apparent anesthesia complications

## 2013-04-13 NOTE — Progress Notes (Signed)
Patient ID: Tracy Bradford, female   DOB: 07-07-1935, 78 y.o.   MRN: [id number] Patient seen and examined. Consent witnessed and signed. No changes noted. Update completed. CBC    Component Value Date/Time   WBC 6.9 04/07/2013 1430   RBC 4.55 04/07/2013 1430   HGB 14.1 04/07/2013 1430   HCT 42.4 04/07/2013 1430   PLT 198 04/07/2013 1430   MCV 93.2 04/07/2013 1430   MCH 31.0 04/07/2013 1430   MCHC 33.3 04/07/2013 1430   RDW 13.6 04/07/2013 1430   LYMPHSABS 0.9 02/11/2013 0350   MONOABS 0.5 02/11/2013 0350   EOSABS 0.0 02/11/2013 0350   BASOSABS 0.0 02/11/2013 0350

## 2013-04-13 NOTE — Op Note (Signed)
04/13/2013  12:20 PM  PATIENT:  Tracy Bradford  78 y.o. female  PRE-OPERATIVE DIAGNOSIS:  right ovarian cyst  POST-OPERATIVE DIAGNOSIS:  right ovarian cyst Degenerating fibroid Extensive pelvic adhesions  PROCEDURE:  Procedure(s): ROBOTIC ASSISTED BILATERAL SALPINGO OOPHORECTOMY;Lysis of Bowel adhesions,myomectomy, removal of endocervical polyp  SURGEON:  Surgeon(s): Lenoard Aden, MD Serita Kyle, MD  ASSISTANTS: Cherly Hensen, MD   ANESTHESIA:   local and general  ESTIMATED BLOOD LOSS: * No blood loss amount entered *   DRAINS: Urinary Catheter (Foley)   LOCAL MEDICATIONS USED:  MARCAINE     SPECIMEN:  Source of Specimen:  Ovaries, tubes, fibroid  DISPOSITION OF SPECIMEN:  PATHOLOGY  COUNTS:  YES  DICTATION #: [id number]  PLAN OF CARE: DC home  PATIENT DISPOSITION:  PACU - hemodynamically stable.

## 2013-04-13 NOTE — Anesthesia Preprocedure Evaluation (Signed)
Anesthesia Evaluation  Patient identified by MRN, date of birth, ID band Patient awake    Reviewed: Allergy & Precautions, H&P , NPO status , Patient's Chart, lab work & pertinent test results, reviewed documented beta blocker date and time   History of Anesthesia Complications Negative for: history of anesthetic complications  Airway Mallampati: III TM Distance: >3 FB Neck ROM: full    Dental  (+) Teeth Intact   Pulmonary neg pulmonary ROS,  breath sounds clear to auscultation  Pulmonary exam normal       Cardiovascular Rhythm:regular Rate:Normal  White coat hypertension - was 110/72 at dr Jorene Minors office last week (178/85 today)   Neuro/Psych negative neurological ROS  negative psych ROS   GI/Hepatic negative GI ROS, Neg liver ROS,   Endo/Other  negative endocrine ROS  Renal/GU Renal disease (h/o kidney stones)     Musculoskeletal   Abdominal   Peds  Hematology negative hematology ROS (+)   Anesthesia Other Findings   Reproductive/Obstetrics negative OB ROS                           Anesthesia Physical Anesthesia Plan  ASA: II  Anesthesia Plan: General ETT   Post-op Pain Management:    Induction:   Airway Management Planned:   Additional Equipment:   Intra-op Plan:   Post-operative Plan:   Informed Consent: I have reviewed the patients History and Physical, chart, labs and discussed the procedure including the risks, benefits and alternatives for the proposed anesthesia with the patient or authorized representative who has indicated his/her understanding and acceptance.   Dental Advisory Given  Plan Discussed with: CRNA and Surgeon  Anesthesia Plan Comments:         Anesthesia Quick Evaluation

## 2013-04-14 ENCOUNTER — Encounter (HOSPITAL_COMMUNITY): Payer: Self-pay | Admitting: Obstetrics and Gynecology

## 2013-04-14 NOTE — Op Note (Signed)
NAMESALLEY, BOXLEY                   ACCOUNT NO.:  [id number]  MEDICAL RECORD NO.:  [id number]  LOCATION:  [facility]                          FACILITY:  [facility]  PHYSICIAN:  Lenoard Aden, M.D.DATE OF BIRTH:  [birth year]  DATE OF PROCEDURE: DATE OF DISCHARGE:  04/13/2013                              OPERATIVE REPORT   PREOPERATIVE DIAGNOSIS:  Complex right ovarian cyst.  POSTOPERATIVE DIAGNOSIS:  Significant pelvic adhesions, right anterior degenerative exophytic fibroid, large endocervical polyp  SURGEON:  Lenoard Aden, M.D.  ASSISTANT:  Maxie Better, M.D..  ANESTHESIA:  General.  ESTIMATED BLOOD LOSS:  Less than 50 mL.  COMPLICATIONS:  Uterine perforation with Rumi retractor.  DRAINS:  Foley.  COUNTS:  Correct.  BRIEF OPERATIVE NOTE:  After being apprised of risks of anesthesia, infection, bleeding, injury to surrounding organs, possible need for repair, delayed versus immediate complications to include bowel and bladder injury, possible need for repair, the patient was brought to the operating room where she was administered general anesthetic without complications.  Prepped and draped in usual sterile fashion.  Foley catheter placed.  Exam under anesthesia reveals a retroflexed uterus and no adnexal masses.  Rumi retractor was then placed per vagina without difficulty.  Attention was turned to the abdomen whereby infraumbilical incision was made with a scalpel, Veress needle placed, opening pressure of -2, 3 AL of CO2 insufflated without difficulty.  Visualization revealed multiple bowel adhesions into the right lower quadrant, left lower quadrant, and a difficult to elevate uterus.  The Rumi retractors was then replaced securing an 8 cm Rumi tip from the 6 cm Rumi tip and better retraction is noted.  At this time, the decision was made to proceed with a multi-port robotic case.  Two 8 mm incisions were made on the right, 5mm and an 8mm on the left, and 15 mm  trocar site established in the midline.  Robot was docked in a standard fashion using ProGrasp and EndoShears and PK forceps.  At this time, the assistant was attempting to elevate the uterus, perforate through the right lateral fundal area right at the portion where the right tube enters.  Bleeding was then controlled with bipolar cautery, and attention was turned to the right adnexa whereby significant bowel adhesions were lysed not only from this mass that occupies the anterior right cul-de-sac, but also from the right adnexa.  The retroperitoneal space was entered.  The ureter was identified.  At this time, the ureter identified on the medial leaf of the  peritoneum, a window was made in the right lateral peritoneum, and infundibulopelvic ligament was skeletonized, cauterized, and divided.  Progressive bites down the mesosalpinx were used and ovarian ligaments were used to divide the entire right adnexa.  Specimen was placed anterior to the cul-de- sac.  On the left side, there was significant bowel adhesions as well, which were lysed using sharp dissection.  The retroperitoneal space was entered.  A window was made in the peritoneum after the ureter was identified.  The infundibulopelvic ligament was skeletonized and the left adnexa was also detached in the standard fashion taking progressive bites down to the level of the uterotubal junction  and the ovarian ligament.  This was placed in the anterior cul-de-sac.  The mass in the right anterior cul-de-sac was then elevated, dissected sharply off the peritoneum after opening the retroperitoneal space and lysing significant bowel adhesions surrounding, and good hemostasis was assured.  Irrigation was accomplished and at this time the robot was undocked.  An 8-mm camera was placed.  The specimen was then placed into an EndoCatch and removed through the abdominal port.  The fibroid was removed separately from the adnexa.  Good hemostasis  was noted.  All instruments were removed.  CO2 was released.  Incision was closed using 0-Vicryl, 4-0 Vicryl, and Dermabond.  Vaginally, there was a large endocervical polyp that had previously been noted on the cervix.  This was removed using sharp dissection and sent to Pathology without difficulty.  Good hemostasis was achieved at all levels.  The patient tolerated the procedure well, was awakened, and transferred to recovery in good condition.     Lenoard Aden, M.D.     RJT/MEDQ  D:  04/13/2013  T:  04/14/2013  Job:  [id number]

## 2013-06-24 IMAGING — MG MM DIGITAL SCREENING BILAT W/ CAD
4 series · 4 of 4 positions shown · non-contrast
Comparison: Prior studies.

DG SCREEN MAMMOGRAM BILATERAL
Bilateral CC and MLO view(s) were taken.
Technologist: Yung Valdez, RT, RM
Prior study comparison: April 28, 2008, DG screen mammogram bilateral.

DIGITAL SCREENING MAMMOGRAM WITH CAD:

[R CC]
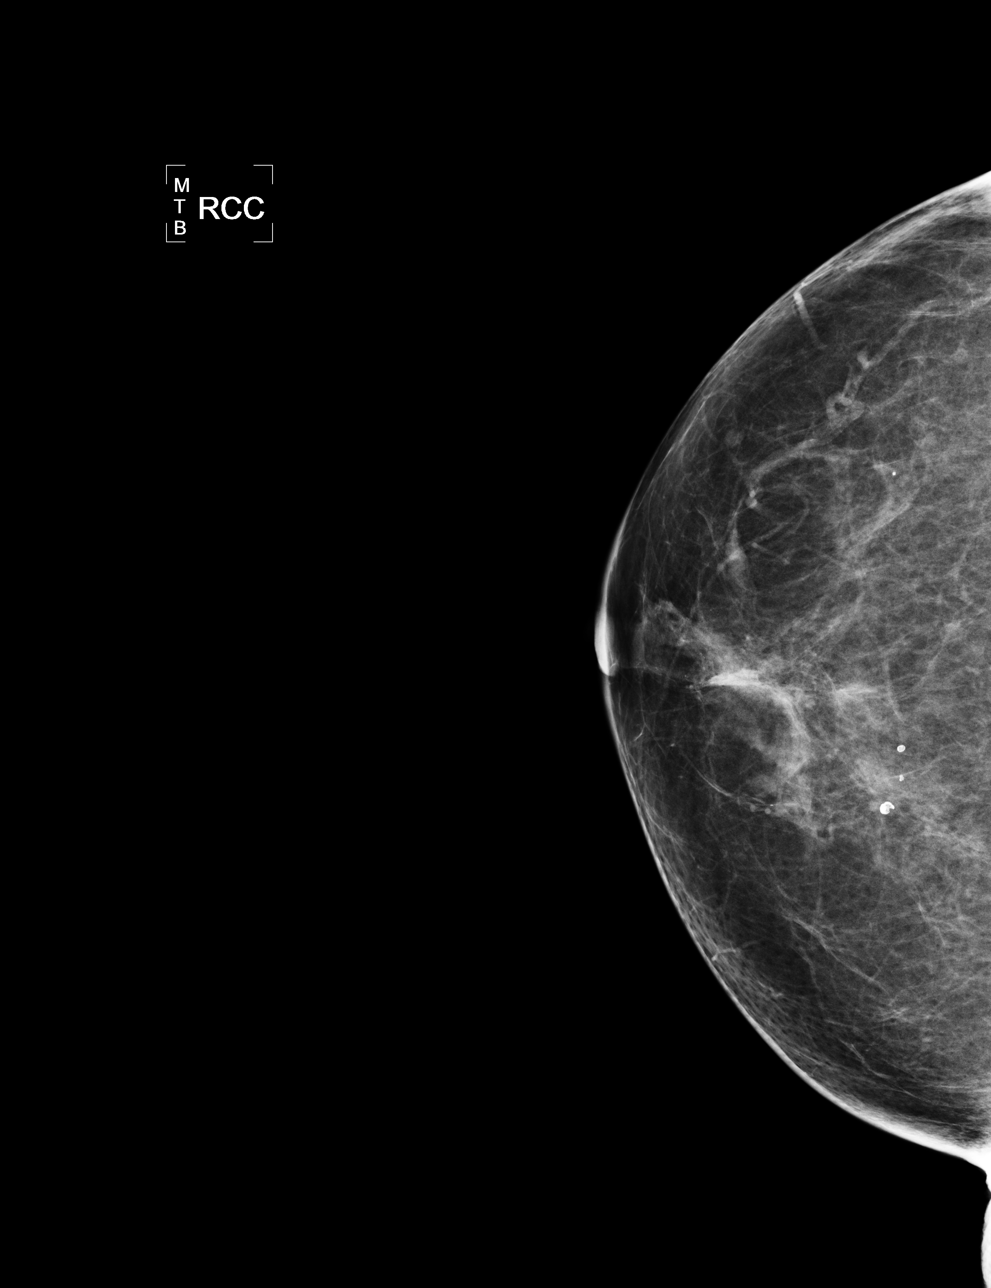

[R MLO]
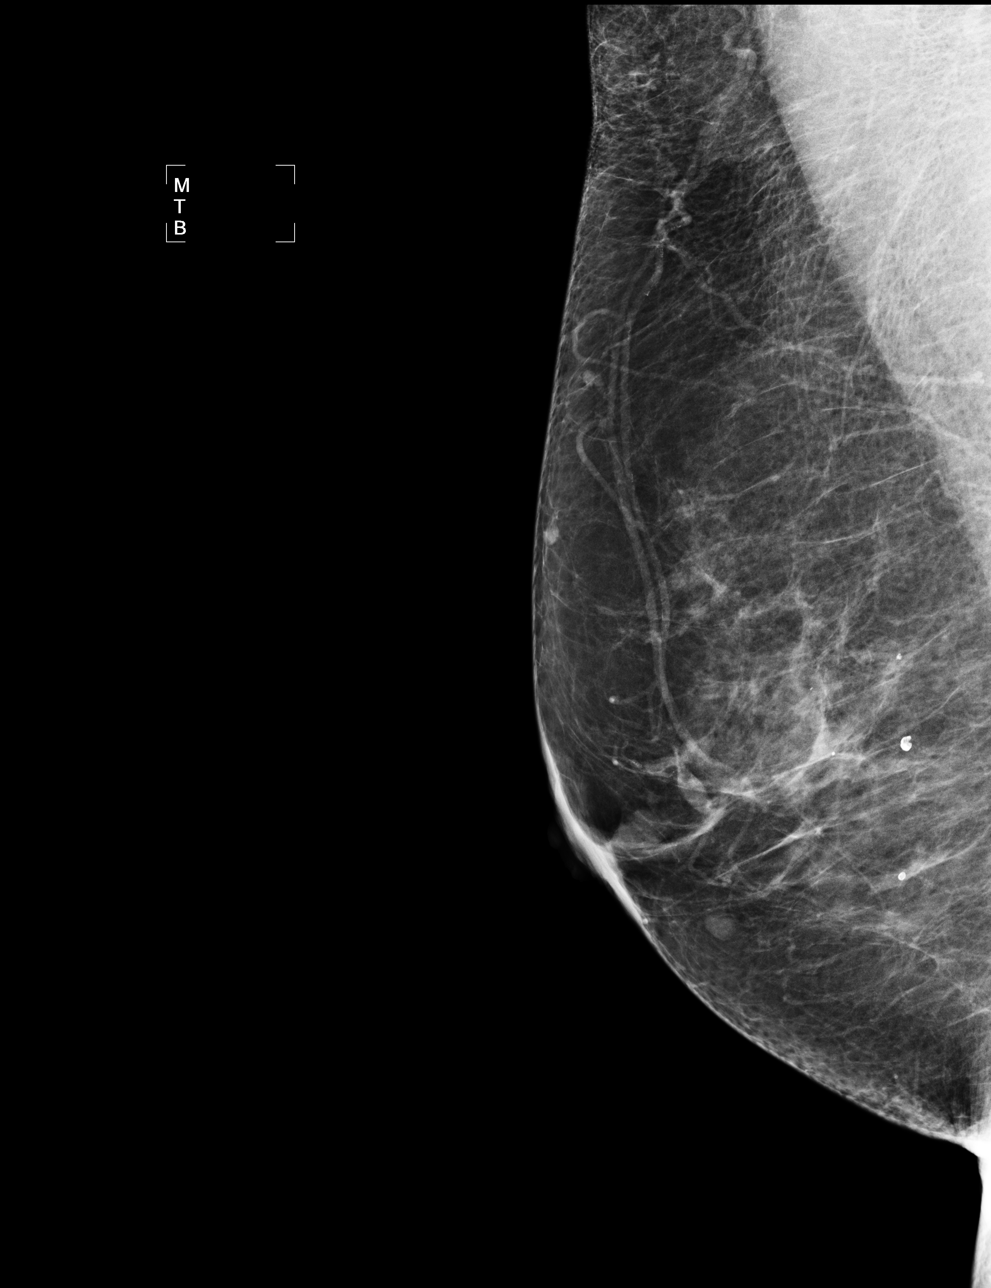

[L CC]
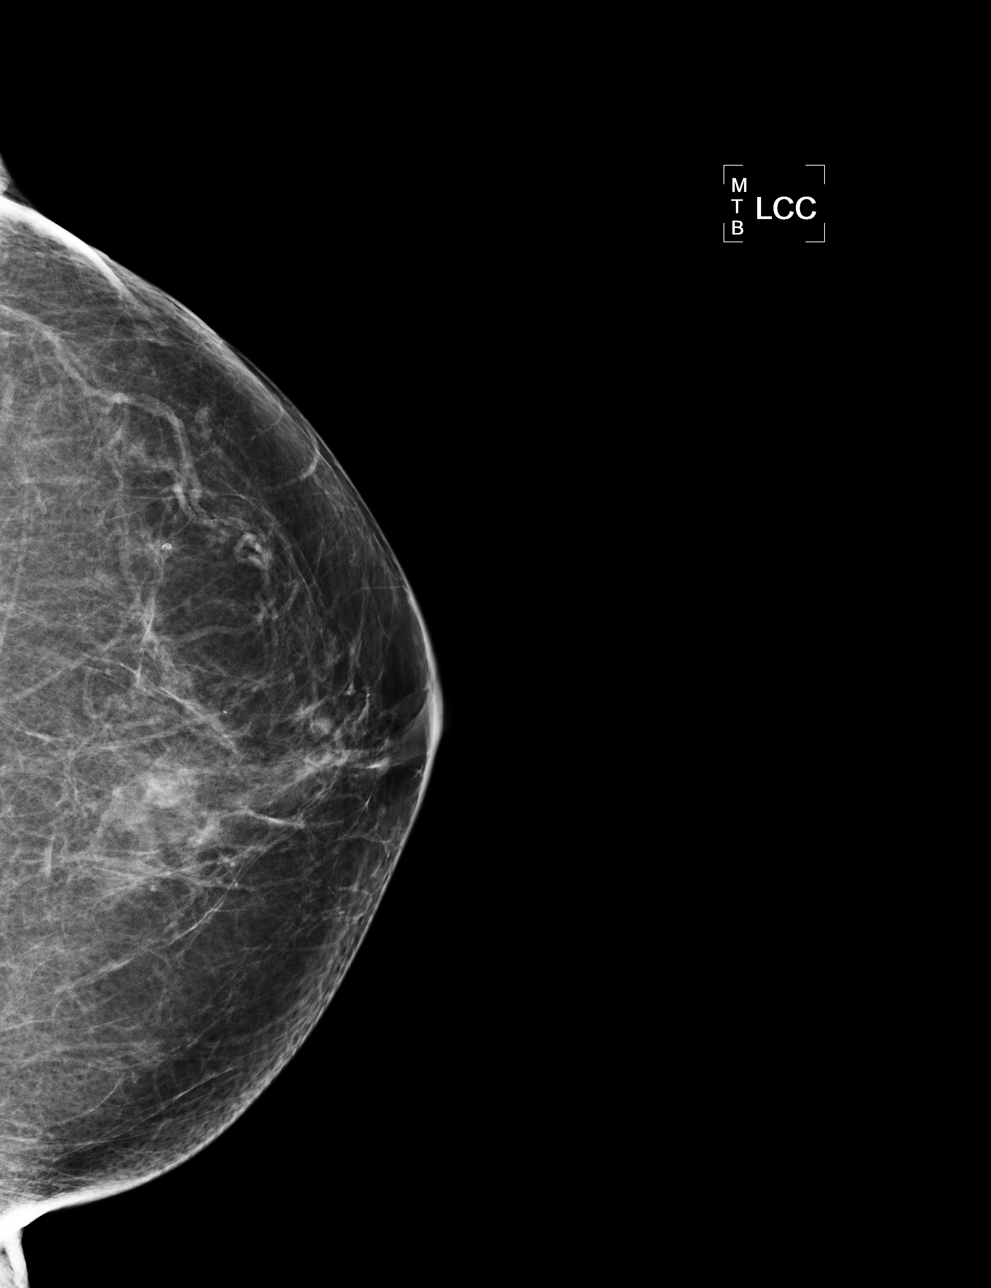

[L MLO]
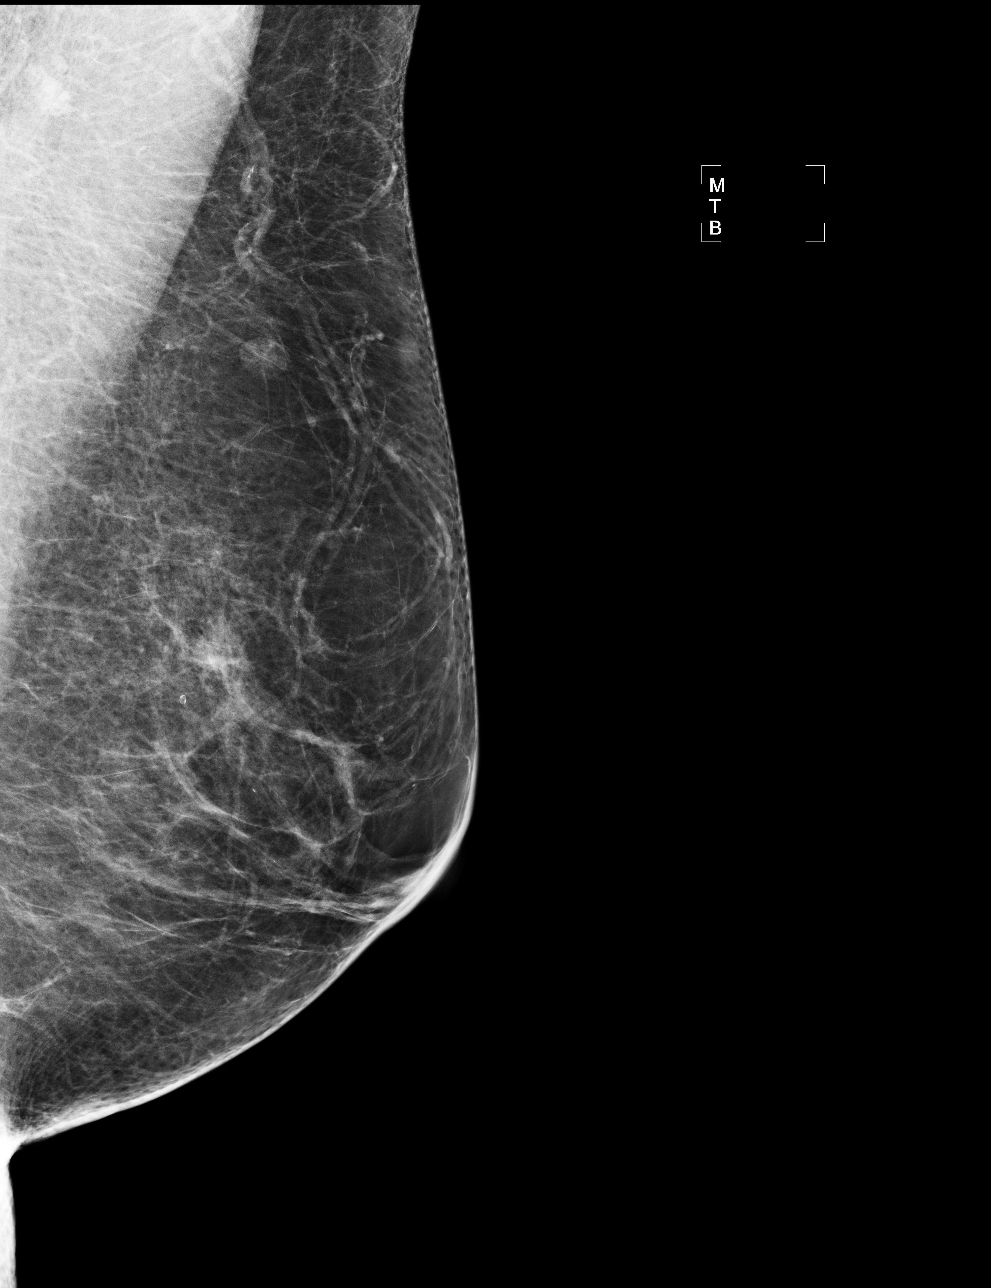

[4 of 4 positions shown; findings below may reference images not displayed]

There are scattered fibroglandular densities.  A possible mass is noted in the left breast.  Spot 
compression views and possibly sonography are recommended for further evaluation.  The right breast
is unremarkable.

Images were processed with CAD.
IMPRESSION: Possible mass, left breast.  Additional evaluation is indicated.  The patient will be contacted for
additional studies and a supplemental report will follow.

ASSESSMENT: Need additional imaging evaluation and/or prior mammograms for comparison - BI-RADS 0 -
Left

Further imaging of the left breast.
,

## 2013-07-09 ENCOUNTER — Other Ambulatory Visit (HOSPITAL_COMMUNITY): Payer: Self-pay | Admitting: Family Medicine

## 2013-07-09 DIAGNOSIS — Z1231 Encounter for screening mammogram for malignant neoplasm of breast: Secondary | ICD-10-CM

## 2013-08-03 ENCOUNTER — Ambulatory Visit (HOSPITAL_COMMUNITY)
Admission: RE | Admit: 2013-08-03 | Discharge: 2013-08-03 | Disposition: A | Discharge status: Home / Self Care | Payer: Medicare Other | Source: Ambulatory Visit | Attending: Family Medicine | Admitting: Family Medicine

## 2013-08-03 DIAGNOSIS — Z1231 Encounter for screening mammogram for malignant neoplasm of breast: Secondary | ICD-10-CM

## 2014-08-11 ENCOUNTER — Other Ambulatory Visit (HOSPITAL_COMMUNITY): Payer: Self-pay | Admitting: Family Medicine

## 2014-08-11 DIAGNOSIS — Z1231 Encounter for screening mammogram for malignant neoplasm of breast: Secondary | ICD-10-CM

## 2014-08-13 ENCOUNTER — Ambulatory Visit (HOSPITAL_COMMUNITY)
Admission: RE | Admit: 2014-08-13 | Discharge: 2014-08-13 | Disposition: A | Discharge status: Home / Self Care | Payer: Medicare Other | Source: Ambulatory Visit | Attending: Family Medicine | Admitting: Family Medicine

## 2014-08-13 DIAGNOSIS — Z1231 Encounter for screening mammogram for malignant neoplasm of breast: Secondary | ICD-10-CM

## 2015-08-10 ENCOUNTER — Other Ambulatory Visit: Payer: Self-pay

## 2015-08-10 DIAGNOSIS — Z1231 Encounter for screening mammogram for malignant neoplasm of breast: Secondary | ICD-10-CM

## 2015-09-05 ENCOUNTER — Ambulatory Visit
Admission: RE | Admit: 2015-09-05 | Discharge: 2015-09-05 | Disposition: A | Discharge status: Home / Self Care | Payer: Medicare Other | Source: Ambulatory Visit

## 2015-09-05 DIAGNOSIS — Z1231 Encounter for screening mammogram for malignant neoplasm of breast: Secondary | ICD-10-CM

## 2016-08-08 ENCOUNTER — Other Ambulatory Visit: Payer: Self-pay | Admitting: Family Medicine

## 2016-08-08 DIAGNOSIS — Z1231 Encounter for screening mammogram for malignant neoplasm of breast: Secondary | ICD-10-CM

## 2016-08-21 ENCOUNTER — Emergency Department (HOSPITAL_COMMUNITY): Payer: Medicare Other

## 2016-08-21 ENCOUNTER — Encounter (HOSPITAL_COMMUNITY): Payer: Self-pay

## 2016-08-21 ENCOUNTER — Inpatient Hospital Stay (HOSPITAL_COMMUNITY)
Admission: EM | Admit: 2016-08-21 | Discharge: 2016-08-23 | DRG: 153 | Disposition: A | Discharge status: Home / Self Care | Payer: Medicare Other | Attending: Internal Medicine | Admitting: Internal Medicine

## 2016-08-21 DIAGNOSIS — R011 Cardiac murmur, unspecified: Secondary | ICD-10-CM

## 2016-08-21 DIAGNOSIS — M25511 Pain in right shoulder: Secondary | ICD-10-CM | POA: Diagnosis not present

## 2016-08-21 DIAGNOSIS — R52 Pain, unspecified: Secondary | ICD-10-CM

## 2016-08-21 DIAGNOSIS — N200 Calculus of kidney: Secondary | ICD-10-CM

## 2016-08-21 DIAGNOSIS — R6889 Other general symptoms and signs: Secondary | ICD-10-CM

## 2016-08-21 DIAGNOSIS — M47816 Spondylosis without myelopathy or radiculopathy, lumbar region: Secondary | ICD-10-CM | POA: Diagnosis present

## 2016-08-21 DIAGNOSIS — R000 Tachycardia, unspecified: Secondary | ICD-10-CM

## 2016-08-21 DIAGNOSIS — J111 Influenza due to unidentified influenza virus with other respiratory manifestations: Principal | ICD-10-CM | POA: Diagnosis present

## 2016-08-21 DIAGNOSIS — Z91018 Allergy to other foods: Secondary | ICD-10-CM

## 2016-08-21 DIAGNOSIS — D259 Leiomyoma of uterus, unspecified: Secondary | ICD-10-CM | POA: Diagnosis present

## 2016-08-21 DIAGNOSIS — M545 Low back pain, unspecified: Secondary | ICD-10-CM

## 2016-08-21 DIAGNOSIS — R6510 Systemic inflammatory response syndrome (SIRS) of non-infectious origin without acute organ dysfunction: Secondary | ICD-10-CM | POA: Diagnosis present

## 2016-08-21 DIAGNOSIS — A419 Sepsis, unspecified organism: Secondary | ICD-10-CM | POA: Diagnosis present

## 2016-08-21 LAB — CBC WITH DIFFERENTIAL/PLATELET
BASOS PCT: 0 %
Basophils Absolute: 0 10*3/uL (ref 0.0–0.1)
EOS ABS: 0 10*3/uL (ref 0.0–0.7)
Eosinophils Relative: 0 %
HCT: 40.9 % (ref 36.0–46.0)
Hemoglobin: 13.9 g/dL (ref 12.0–15.0)
Lymphocytes Relative: 3 %
Lymphs Abs: 0.4 10*3/uL — ABNORMAL LOW (ref 0.7–4.0)
MCH: 31.7 pg (ref 26.0–34.0)
MCHC: 34 g/dL (ref 30.0–36.0)
MCV: 93.4 fL (ref 78.0–100.0)
MONOS PCT: 3 %
Monocytes Absolute: 0.4 10*3/uL (ref 0.1–1.0)
NEUTROS PCT: 93 %
Neutro Abs: 10.9 10*3/uL — ABNORMAL HIGH (ref 1.7–7.7)
Platelets: 149 10*3/uL — ABNORMAL LOW (ref 150–400)
RBC: 4.38 MIL/uL (ref 3.87–5.11)
RDW: 13.9 % (ref 11.5–15.5)
WBC: 11.7 10*3/uL — ABNORMAL HIGH (ref 4.0–10.5)

## 2016-08-21 LAB — URINALYSIS, ROUTINE W REFLEX MICROSCOPIC
BILIRUBIN URINE: NEGATIVE
Glucose, UA: NEGATIVE mg/dL
Ketones, ur: 20 mg/dL — AB
LEUKOCYTES UA: NEGATIVE
NITRITE: NEGATIVE
PROTEIN: NEGATIVE mg/dL
Specific Gravity, Urine: 1.013 (ref 1.005–1.030)
pH: 7 (ref 5.0–8.0)

## 2016-08-21 LAB — COMPREHENSIVE METABOLIC PANEL
ALT: 34 U/L (ref 14–54)
ANION GAP: 13 (ref 5–15)
AST: 37 U/L (ref 15–41)
Albumin: 4.8 g/dL (ref 3.5–5.0)
Alkaline Phosphatase: 52 U/L (ref 38–126)
BUN: 21 mg/dL — ABNORMAL HIGH (ref 6–20)
CHLORIDE: 102 mmol/L (ref 101–111)
CO2: 24 mmol/L (ref 22–32)
Calcium: 9.1 mg/dL (ref 8.9–10.3)
Creatinine, Ser: 0.69 mg/dL (ref 0.44–1.00)
GFR calc Af Amer: >60 mL/min (ref >60)
GFR calc non Af Amer: >60 mL/min (ref >60)
Glucose, Bld: 152 mg/dL — ABNORMAL HIGH (ref 65–99)
POTASSIUM: 3.8 mmol/L (ref 3.5–5.1)
SODIUM: 139 mmol/L (ref 135–145)
Total Bilirubin: 1.4 mg/dL — ABNORMAL HIGH (ref 0.3–1.2)
Total Protein: 7.5 g/dL (ref 6.5–8.1)

## 2016-08-21 LAB — I-STAT CG4 LACTIC ACID, ED
Lactic Acid, Venous: 1.57 mmol/L (ref 0.5–1.9)
Lactic Acid, Venous: 1.57 mmol/L (ref 0.5–1.9)

## 2016-08-21 LAB — LIPASE, BLOOD: Lipase: 22 U/L (ref 11–51)

## 2016-08-21 MED ORDER — IOPAMIDOL (ISOVUE-300) INJECTION 61%
100.0000 mL | Freq: Once | INTRAVENOUS | Status: AC | PRN
  Administered 2016-08-21: 100 mL via INTRAVENOUS

## 2016-08-21 MED ORDER — PIPERACILLIN-TAZOBACTAM 3.375 G IVPB 30 MIN
3.3750 g | Freq: Once | INTRAVENOUS | Status: AC
  Administered 2016-08-21: 3.375 g via INTRAVENOUS
  Filled 2016-08-21: qty 50

## 2016-08-21 MED ORDER — HYDROCODONE-ACETAMINOPHEN 5-325 MG PO TABS
1.0000 | ORAL_TABLET | ORAL | Status: DC | PRN
  Administered 2016-08-21 – 2016-08-22 (×4): 1 via ORAL
  Filled 2016-08-21 (×4): qty 1

## 2016-08-21 MED ORDER — VANCOMYCIN HCL IN DEXTROSE 1-5 GM/200ML-% IV SOLN
1000.0000 mg | Freq: Once | INTRAVENOUS | Status: AC
  Administered 2016-08-21: 1000 mg via INTRAVENOUS
  Filled 2016-08-21: qty 200

## 2016-08-21 MED ORDER — VANCOMYCIN HCL 500 MG IV SOLR
500.0000 mg | Freq: Two times a day (BID) | INTRAVENOUS | Status: DC
  Administered 2016-08-21 – 2016-08-22 (×2): 500 mg via INTRAVENOUS
  Filled 2016-08-21 (×2): qty 500

## 2016-08-21 MED ORDER — POLYETHYLENE GLYCOL 3350 17 G PO PACK: 17.0000 g | PACK | Freq: Every day | ORAL | Status: DC | PRN

## 2016-08-21 MED ORDER — ACETAMINOPHEN 650 MG RE SUPP: 650.0000 mg | Freq: Four times a day (QID) | RECTAL | Status: DC | PRN

## 2016-08-21 MED ORDER — IOPAMIDOL (ISOVUE-300) INJECTION 61%
30.0000 mL | Freq: Once | INTRAVENOUS | Status: AC | PRN
  Administered 2016-08-21: 30 mL via ORAL

## 2016-08-21 MED ORDER — ACETAMINOPHEN 325 MG PO TABS
650.0000 mg | ORAL_TABLET | Freq: Four times a day (QID) | ORAL | Status: DC | PRN
  Filled 2016-08-21: qty 2

## 2016-08-21 MED ORDER — IOPAMIDOL (ISOVUE-300) INJECTION 61%
INTRAVENOUS | Status: AC
  Filled 2016-08-21: qty 100

## 2016-08-21 MED ORDER — ENOXAPARIN SODIUM 40 MG/0.4ML ~~LOC~~ SOLN
40.0000 mg | SUBCUTANEOUS | Status: DC
  Administered 2016-08-21 – 2016-08-22 (×2): 40 mg via SUBCUTANEOUS
  Filled 2016-08-21 (×2): qty 0.4

## 2016-08-21 MED ORDER — SODIUM CHLORIDE 0.45 % IV SOLN
INTRAVENOUS | Status: DC
  Administered 2016-08-21 – 2016-08-22 (×2): via INTRAVENOUS

## 2016-08-21 MED ORDER — IOPAMIDOL (ISOVUE-300) INJECTION 61%
INTRAVENOUS | Status: AC
  Administered 2016-08-21: 30 mL via ORAL
  Filled 2016-08-21: qty 30

## 2016-08-21 MED ORDER — ONDANSETRON HCL 4 MG/2ML IJ SOLN
4.0000 mg | Freq: Once | INTRAMUSCULAR | Status: AC
  Administered 2016-08-21: 4 mg via INTRAVENOUS
  Filled 2016-08-21: qty 2

## 2016-08-21 MED ORDER — SODIUM CHLORIDE 0.9 % IV SOLN: 1000.0000 mL | INTRAVENOUS | Status: DC

## 2016-08-21 MED ORDER — SODIUM CHLORIDE 0.9 % IV BOLUS (SEPSIS)
1000.0000 mL | Freq: Once | INTRAVENOUS | Status: AC
  Administered 2016-08-21: 1000 mL via INTRAVENOUS

## 2016-08-21 MED ORDER — ACETAMINOPHEN 500 MG PO TABS
1000.0000 mg | ORAL_TABLET | Freq: Once | ORAL | Status: AC
  Administered 2016-08-21: 1000 mg via ORAL
  Filled 2016-08-21: qty 2

## 2016-08-21 MED ORDER — PIPERACILLIN-TAZOBACTAM 3.375 G IVPB
3.3750 g | Freq: Three times a day (TID) | INTRAVENOUS | Status: DC
  Administered 2016-08-21 – 2016-08-23 (×5): 3.375 g via INTRAVENOUS
  Filled 2016-08-21 (×4): qty 50

## 2016-08-21 NOTE — H&P (Addendum)
History and Physical    Tracy Bradford [id number] DOB: 1935/03/30 DOA: 08/21/2016  PCP: Leonard Downing, MD Patient coming from: Home  Chief Complaint: Back pain  HPI: Tracy Bradford is a 82 y.o. female with medical history significant of nephrolithiasis. Around 12Pm this afternoon, she started having sharp, lower back pain that was located throughout her lower back. The pain did not radiate. Nothing relieved her pain and she did not try anything to help. Pain spontaneously resolved while in the emergency department.  ED Course: Vitals: Febrile to 104.1F, tachycardic, normotensive, on room air Labs: Total bilirubin slightly elevated at 1.4. WBC of 11.7. Platelets of 149. Lactic acid of 1.57 Imaging: Chest x-ray not significant for acute cardiopulmonary disease. CT scan significant for periportal edema and low-grade mesenteric/left-sided omental edema; abnormal central hypodensity in the uterus; nonobstructive nephrolithiasis; hepatic and renal lesions Medications/Course: 1 L normal saline, vancomycin and Zosyn  Review of Systems: Review of Systems  Constitutional: Negative for chills and fever.  Respiratory: Negative for cough and shortness of breath.   Cardiovascular: Negative for chest pain and palpitations.  Gastrointestinal: Negative for abdominal pain, blood in stool, constipation, diarrhea, melena, nausea and vomiting.  Genitourinary: Negative for dysuria.  Musculoskeletal: Positive for back pain.  All other systems reviewed and are negative.   Past Medical History:  Diagnosis Date  . Kidney stones     Past Surgical History:  Procedure Laterality Date  . kidney stone removal    . ROBOTIC ASSISTED BILATERAL SALPINGO OOPHERECTOMY Bilateral 04/13/2013   Procedure: ROBOTIC ASSISTED BILATERAL SALPINGO OOPHORECTOMY;Lysis of Bowel adhesions,myomectomy, removal of endocervical polyp;  Surgeon: Lovenia Kim, MD;  Location: [facility];  Service: Gynecology;  Laterality: Bilateral;    . TONSILLECTOMY       reports that she has never smoked. She has never used smokeless tobacco. She reports that she does not drink alcohol or use drugs.  Allergies  Allergen Reactions  . Lemon Flavor Nausea And Vomiting    Lemons     Family History  Problem Relation Age of Onset  . Kidney Stones Father     Prior to Admission medications   Medication Sig Start Date End Date Taking? Authorizing Provider  ibuprofen (ADVIL,MOTRIN) 200 MG tablet Take 200 mg by mouth every 6 (six) hours as needed for fever or moderate pain.   Yes Historical Provider, MD    Physical Exam: Vitals:   08/21/16 1400 08/21/16 1557 08/21/16 1558 08/21/16 1600  BP:  126/70    Pulse: (!) 121  114   Resp: 17     Temp:    100.2 F (37.9 C)  TempSrc:    Oral  SpO2: 100%  94%   Weight:      Height:         Constitutional: NAD, calm, comfortable Eyes: PERRL, lids and conjunctivae normal ENMT: Mucous membranes are moist. Posterior pharynx clear of any exudate or lesions Neck: normal, supple, no masses, no thyromegaly Respiratory: clear to auscultation bilaterally, no wheezing, no crackles. Normal respiratory effort. No accessory muscle use.  Cardiovascular: Regular rate and rhythm, 2/6 systolic murmur. No extremity edema. 2+ pedal pulses.  Abdomen: no tenderness, no masses palpated. No hepatosplenomegaly. Bowel sounds positive.  Musculoskeletal: no clubbing / cyanosis. No joint deformity upper and lower extremities. Good ROM, no contractures. Normal muscle tone.  Skin: no rashes, lesions, ulcers. No induration Neurologic: CN 2-12 grossly intact. Sensation intact, DTR normal. Strength 4/5 in all 4.  Psychiatric: Normal judgment and insight. Alert  and oriented x 3. Normal mood.    Labs on Admission: I have personally reviewed following labs and imaging studies  CBC:  Recent Labs Lab 08/21/16 1248  WBC 11.7*  NEUTROABS 10.9*  HGB 13.9  HCT 40.9  MCV 93.4  PLT 123456*   Basic Metabolic  Panel:  Recent Labs Lab 08/21/16 1248  NA 139  K 3.8  CL 102  CO2 24  GLUCOSE 152*  BUN 21*  CREATININE 0.69  CALCIUM 9.1   GFR: Estimated Creatinine Clearance: 45.1 mL/min (by C-G formula based on SCr of 0.69 mg/dL). Liver Function Tests:  Recent Labs Lab 08/21/16 1248  AST 37  ALT 34  ALKPHOS 52  BILITOT 1.4*  PROT 7.5  ALBUMIN 4.8    Recent Labs Lab 08/21/16 1248  LIPASE 22   No results for input(s): AMMONIA in the last 168 hours. Coagulation Profile: No results for input(s): INR, PROTIME in the last 168 hours. Cardiac Enzymes: No results for input(s): CKTOTAL, CKMB, CKMBINDEX, TROPONINI in the last 168 hours. BNP (last 3 results) No results for input(s): PROBNP in the last 8760 hours. HbA1C: No results for input(s): HGBA1C in the last 72 hours. CBG: No results for input(s): GLUCAP in the last 168 hours. Lipid Profile: No results for input(s): CHOL, HDL, LDLCALC, TRIG, CHOLHDL, LDLDIRECT in the last 72 hours. Thyroid Function Tests: No results for input(s): TSH, T4TOTAL, FREET4, T3FREE, THYROIDAB in the last 72 hours. Anemia Panel: No results for input(s): VITAMINB12, FOLATE, FERRITIN, TIBC, IRON, RETICCTPCT in the last 72 hours. Urine analysis:    Component Value Date/Time   COLORURINE YELLOW 08/21/2016 1211   APPEARANCEUR CLEAR 08/21/2016 1211   LABSPEC 1.013 08/21/2016 1211   PHURINE 7.0 08/21/2016 1211   GLUCOSEU NEGATIVE 08/21/2016 1211   HGBUR SMALL (A) 08/21/2016 1211   BILIRUBINUR NEGATIVE 08/21/2016 1211   KETONESUR 20 (A) 08/21/2016 1211   PROTEINUR NEGATIVE 08/21/2016 1211   UROBILINOGEN 0.2 02/11/2013 0413   NITRITE NEGATIVE 08/21/2016 1211   LEUKOCYTESUR NEGATIVE 08/21/2016 1211   Sepsis Labs: !!!!!!!!!!!!!!!!!!!!!!!!!!!!!!!!!!!!!!!!!!!! @LABRCNTIP (procalcitonin:4,lacticidven:4) )No results found for this or any previous visit (from the past 240 hour(s)).   Radiological Exams on Admission: Dg Chest 2 View  Result Date:  08/21/2016 CLINICAL DATA:  82 year old female with history of fever and lower abdominal pain. EXAM: CHEST  2 VIEW COMPARISON:  No priors. FINDINGS: Lung volumes are normal. No consolidative airspace disease. No pleural effusions. No pneumothorax. No pulmonary nodule or mass noted. Pulmonary vasculature and the cardiomediastinal silhouette are within normal limits. Atherosclerosis in the thoracic aorta. IMPRESSION: 1.  No radiographic evidence of acute cardiopulmonary disease. 2. Aortic atherosclerosis. Electronically Signed   By: Vinnie Langton M.D.   On: 08/21/2016 13:39   Ct Abdomen Pelvis W Contrast  Result Date: 08/21/2016 CLINICAL DATA:  Dizziness; n/v; low back pain since 1130 EXAM: CT ABDOMEN AND PELVIS WITH CONTRAST TECHNIQUE: Multidetector CT imaging of the abdomen and pelvis was performed using the standard protocol following bolus administration of intravenous contrast. CONTRAST:  157mL ISOVUE-300 IOPAMIDOL (ISOVUE-300) INJECTION 61% COMPARISON:  02/11/2013. FINDINGS: Lower chest: Mild scarring in the right lower lobe and dependent subsegmental atelectasis in both lower lobes. Hepatobiliary: Mild periportal edema. 2.0 by 1.5 cm fluid density lesion of the posterior right hepatic lobe on image 26/2, stable. Several other tiny hepatic hypodensities are stable from prior and technically too small to characterize although statistically likely to be benign. Distal CBD 7 mm, within normal limits for age, but with abrupt truncation in  the ampulla. Pancreas: Unremarkable Spleen: Unremarkable Adrenals/Urinary Tract: Adrenal glands normal. Stable right mid kidney fluid density lesion, compatible with cyst. Hypodense lesion of the left mid kidney is technically nonspecific although statistically likely to represent a small cyst with volume averaging. Bilateral small carry renal cysts are present. Right kidney upper pole 6 mm nonobstructive renal calculus, image 32/2. Right mid kidney 1-2 mm calculus. Right  kidney lower pole 3 mm calculus. Left kidney lower pole 5 mm linear calculus on image 65/3. No hydronephrosis or hydroureter.  Urinary bladder unremarkable. Stomach/Bowel: Unremarkable.  Appendix poorly seen. Vascular/Lymphatic: Aortoiliac atherosclerotic vascular disease. No pathologic adenopathy identified. Reproductive: Central hypodensity in the uterus measuring up to 1.5 cm in thickness on image 76 of series 4 possibly representing a thickened endometrium. Other: Low-level mesenteric and possibly left upper quadrant omental edema, nonspecific. Musculoskeletal: Lumbar spondylosis and degenerative disc disease most notable at L4-5 without overt impingement. IMPRESSION: 1. There several nonspecific findings including periportal edema, and low-grade mesenteric and left-sided omental edema. Conceivably this could be due to a low grade enteritis causing mild mesenteric edema as well as the patient's nausea and vomiting. 2. Several hepatic and renal lesions. 3. Nonobstructive nephrolithiasis. 4.  Aortoiliac atherosclerotic vascular disease. 5. There is abnormal central hypodensity in the uterus which could reflect an abnormally thickened endometrium. Pelvic sonography is recommended for further characterization. Electronically Signed   By: Van Clines M.D.   On: 08/21/2016 15:58    EKG: Independently reviewed. Sinus tachycardia  Assessment/Plan Principal Problem:   SIRS (systemic inflammatory response syndrome) (HCC) Active Problems:   Low back pain   Sinus tachycardia   Undiagnosed cardiac murmurs   Nephrolithiasis   SIRS Unknown source. Possible enteritis. Started on broad spectrum. -Vancomycin and zosyn -blood culture -urine culture  Back pain Unknown reason. Back exam is unremarkable. Location suggests SI joint pain. CT scan significant for degenerative disc disease -analgesics for pain prn -MRI if pain worsens or LE weakness  Abnormal finding of uterus Seen on CT scan. -Pelvic  ultrasound to further charaterize  Sinus tachycardia Unknown reason. No chest pain or dyspnea. No leg edema. Asymptomatic.  Cardiac murmur No prior history per patient -outpatient follow-up  Nephrolithiasis Stable. Multiple non-obstructing calculi in both kdineys  Hepatic lesions Stable from previous imaging.   DVT prophylaxis: Lovenox Code Status: Full code Family Communication: Husband at bedside Disposition Plan: Anticipate discharge home in 2-3 days Consults called: None Admission status: Inpatient, medical floor    Cordelia Poche, MD [facility] Pager [phone]  If 7PM-7AM, please contact night-coverage [email or web address] Password [email or web address]  08/21/2016, 4:31 PM

## 2016-08-21 NOTE — Progress Notes (Signed)
Pharmacy Antibiotic Note  Tracy Bradford is a 82 y.o. female with PMH nephrolithiasis admitted on 08/21/2016 with sepsis. Pharmacy has been consulted for Zosyn and vancomycin dosing.  Plan: Zosyn 3.375g IV Q8H infused over 4hrs. Vancomycin 500mg  IV q12h Follow renal function, cultures, and clinical course  Height: 5\' 2"  (157.5 cm) Weight: 116 lb 10 oz (52.9 kg) IBW/kg (Calculated) : 50.1  Temp (24hrs), Avg:100.4 F (38 C), Min:98.2 F (36.8 C), Max:104.4 F (40.2 C)   Recent Labs Lab 08/21/16 1248 08/21/16 1428  WBC 11.7*  --   CREATININE 0.69  --   LATICACIDVEN  --  1.57  1.57    Estimated Creatinine Clearance: 45.1 mL/min (by C-G formula based on SCr of 0.69 mg/dL).    Allergies  Allergen Reactions  . Lemon Flavor Nausea And Vomiting    Lemons     Antimicrobials this admission: Zosyn 1/16 >>  vanc 1/16 >> Dose adjustments this admission: ---   Thank you for allowing pharmacy to be a part of this patient's care.  Dolly Rias RPh 08/21/2016, 7:08 PM Pager [phone]

## 2016-08-21 NOTE — Progress Notes (Signed)
Pharmacy Antibiotic Note  Tracy Bradford is a 82 y.o. female with PMH nephrolithiasis admitted on 08/21/2016 with sepsis. Pharmacy has been consulted for Zosyn dosing.  Plan: Zosyn 3.375 g IV given once over 30 minutes, then every 8 hrs by 4-hr infusion   Height: 5\' 2"  (157.5 cm) Weight: 112 lb (50.8 kg) IBW/kg (Calculated) : 50.1  Temp (24hrs), Avg:101.3 F (38.5 C), Min:98.2 F (36.8 C), Max:104.4 F (40.2 C)  No results for input(s): WBC, CREATININE, LATICACIDVEN, VANCOTROUGH, VANCOPEAK, VANCORANDOM, GENTTROUGH, GENTPEAK, GENTRANDOM, TOBRATROUGH, TOBRAPEAK, TOBRARND, AMIKACINPEAK, AMIKACINTROU, AMIKACIN in the last 168 hours.  CrCl cannot be calculated (Patient's most recent lab result is older than the maximum 21 days allowed.).    Allergies  Allergen Reactions  . Lemon Flavor Nausea And Vomiting    Lemons     Antimicrobials this admission: Zosyn 1/16 >>   Dose adjustments this admission: ---   Thank you for allowing pharmacy to be a part of this patient's care.  Aimar Shrewsbury A 08/21/2016 2:09 PM

## 2016-08-21 NOTE — ED Notes (Signed)
Patient transported to X-ray 

## 2016-08-21 NOTE — ED Notes (Signed)
PA made aware of rectal temp 

## 2016-08-21 NOTE — ED Provider Notes (Signed)
[facility] DEPT Provider Note   CSN: [id number] Arrival date & time: 08/21/16  1211     History   Chief Complaint Chief Complaint  Patient presents with  . Back Pain  . Nausea    HPI Tracy Bradford is a 82 y.o. female.  HPI   Asian is a 82 year old female with history of kidney stones presents to the ED with complaint of back pain, onset 1hr PTA. 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 Patient reports she called her husband to come pick her up to take her home. She notes when she sat in the car she began having waxing and waning sharp pain across her lower back. Denies radiation. Denies aggravating or elevating factors. Due to worsening of symptoms pt decided to come to the ED. patient also notes over the past week she has had mild intermittent pain under her right shoulder blade. Denies any recent fall, trauma or injury. She notes she has been taking ibuprofen at home (last dose 8am) with improvement of symptoms. Denies fever, HA, cough, SOB, wheezing, CP, palpitations, dizziness, abdominal pain, vomiting, diarrhea, urinary symptoms, numbness, tingling weakness, saddle anesthesia. Reports last kidney stone was in 1970s and pt is unable to remember if her pain today feels similar. Abdominal surgical hx includes kidney stone removal, and bilateral salpingo-oophorectomy. Denies any recent hospitalizations.   Past Medical History:  Diagnosis Date  . Kidney stones     Patient Active Problem List   Diagnosis Date Noted  . Sepsis ([place]) 08/21/2016  . Ovarian cyst 03/31/2013    Past Surgical History:  Procedure Laterality Date  . kidney stone removal    . ROBOTIC ASSISTED BILATERAL SALPINGO OOPHERECTOMY Bilateral 04/13/2013   Procedure: ROBOTIC ASSISTED BILATERAL SALPINGO OOPHORECTOMY;Lysis of Bowel adhesions,myomectomy, removal of endocervical polyp;  Surgeon: Lovenia Kim, MD;   Location: [facility];  Service: Gynecology;  Laterality: Bilateral;  . TONSILLECTOMY      OB History    No data available       Home Medications    Prior to Admission medications   Medication Sig Start Date End Date Taking? Authorizing Provider  ibuprofen (ADVIL,MOTRIN) 200 MG tablet Take 200 mg by mouth every 6 (six) hours as needed for fever or moderate pain.   Yes Historical Provider, MD    Family History History reviewed. No pertinent family history.  Social History Social History  Substance Use Topics  . Smoking status: Never Smoker  . Smokeless tobacco: Never Used  . Alcohol use No     Allergies   Lemon flavor   Review of Systems Review of Systems  Constitutional: Positive for chills.  Gastrointestinal: Positive for nausea.  Musculoskeletal: Positive for back pain.  All other systems reviewed and are negative.    Physical Exam Updated Vital Signs BP 121/66 (BP Location: Left Arm)   Pulse 110   Temp 100.2 F (37.9 C) (Oral)   Resp 20   Ht 5\' 2"  (1.575 m)   Wt 50.8 kg   SpO2 95%   BMI 20.49 kg/m   Physical Exam  Constitutional: She is oriented to person, place, and time. She appears well-developed and well-nourished.  Pt intermittently shivering throughout exam  HENT:  Head: Normocephalic and atraumatic.  Mouth/Throat: Uvula is midline, oropharynx is clear and moist and mucous membranes are normal. No oropharyngeal exudate, posterior oropharyngeal edema, posterior oropharyngeal erythema or tonsillar abscesses. No tonsillar exudate.  Eyes:  Conjunctivae and EOM are normal. Right eye exhibits no discharge. Left eye exhibits no discharge. No scleral icterus.  Neck: Normal range of motion. Neck supple.  Cardiovascular: Regular rhythm, normal heart sounds and intact distal pulses.   Tachycardic, HR 124  Pulmonary/Chest: Effort normal and breath sounds normal. No respiratory distress. She has no wheezes. She has no rales. She exhibits no tenderness.    Abdominal: Soft. Bowel sounds are normal. She exhibits no distension and no mass. There is no tenderness. There is no rigidity, no rebound, no guarding, no CVA tenderness and negative Murphy's sign. No hernia.  Musculoskeletal: Normal range of motion. She exhibits no edema, tenderness or deformity.  No midline C, T, or L tenderness. Full range of motion of neck and back. Full range of motion of bilateral upper and lower extremities, with 5/5 strength. Sensation intact. 2+ radial and PT pulses. Cap refill <2 seconds.   Neurological: She is alert and oriented to person, place, and time.  Skin: Skin is warm and dry.  Nursing note and vitals reviewed.    ED Treatments / Results  Labs (all labs ordered are listed, but only abnormal results are displayed) Labs Reviewed  CBC WITH DIFFERENTIAL/PLATELET - Abnormal; Notable for the following:       Result Value   WBC 11.7 (*)    Platelets 149 (*)    Neutro Abs 10.9 (*)    Lymphs Abs 0.4 (*)    All other components within normal limits  COMPREHENSIVE METABOLIC PANEL - Abnormal; Notable for the following:    Glucose, Bld 152 (*)    BUN 21 (*)    Total Bilirubin 1.4 (*)    All other components within normal limits  URINALYSIS, ROUTINE W REFLEX MICROSCOPIC - Abnormal; Notable for the following:    Hgb urine dipstick SMALL (*)    Ketones, ur 20 (*)    Bacteria, UA FEW (*)    Squamous Epithelial / LPF 0-5 (*)    All other components within normal limits  URINE CULTURE  CULTURE, BLOOD (ROUTINE X 2)  CULTURE, BLOOD (ROUTINE X 2)  LIPASE, BLOOD  I-STAT CG4 LACTIC ACID, ED  I-STAT CG4 LACTIC ACID, ED    EKG  EKG Interpretation None       Radiology Dg Chest 2 View  Result Date: 08/21/2016 CLINICAL DATA:  82 year old female with history of fever and lower abdominal pain. EXAM: CHEST  2 VIEW COMPARISON:  No priors. FINDINGS: Lung volumes are normal. No consolidative airspace disease. No pleural effusions. No pneumothorax. No pulmonary  nodule or mass noted. Pulmonary vasculature and the cardiomediastinal silhouette are within normal limits. Atherosclerosis in the thoracic aorta. IMPRESSION: 1.  No radiographic evidence of acute cardiopulmonary disease. 2. Aortic atherosclerosis. Electronically Signed   By: Vinnie Langton M.D.   On: 08/21/2016 13:39   Ct Abdomen Pelvis W Contrast  Result Date: 08/21/2016 CLINICAL DATA:  Dizziness; n/v; low back pain since 1130 EXAM: CT ABDOMEN AND PELVIS WITH CONTRAST TECHNIQUE: Multidetector CT imaging of the abdomen and pelvis was performed using the standard protocol following bolus administration of intravenous contrast. CONTRAST:  177mL ISOVUE-300 IOPAMIDOL (ISOVUE-300) INJECTION 61% COMPARISON:  02/11/2013. FINDINGS: Lower chest: Mild scarring in the right lower lobe and dependent subsegmental atelectasis in both lower lobes. Hepatobiliary: Mild periportal edema. 2.0 by 1.5 cm fluid density lesion of the posterior right hepatic lobe on image 26/2, stable. Several other tiny hepatic hypodensities are stable from prior and technically too small to characterize although statistically  likely to be benign. Distal CBD 7 mm, within normal limits for age, but with abrupt truncation in the ampulla. Pancreas: Unremarkable Spleen: Unremarkable Adrenals/Urinary Tract: Adrenal glands normal. Stable right mid kidney fluid density lesion, compatible with cyst. Hypodense lesion of the left mid kidney is technically nonspecific although statistically likely to represent a small cyst with volume averaging. Bilateral small carry renal cysts are present. Right kidney upper pole 6 mm nonobstructive renal calculus, image 32/2. Right mid kidney 1-2 mm calculus. Right kidney lower pole 3 mm calculus. Left kidney lower pole 5 mm linear calculus on image 65/3. No hydronephrosis or hydroureter.  Urinary bladder unremarkable. Stomach/Bowel: Unremarkable.  Appendix poorly seen. Vascular/Lymphatic: Aortoiliac atherosclerotic  vascular disease. No pathologic adenopathy identified. Reproductive: Central hypodensity in the uterus measuring up to 1.5 cm in thickness on image 76 of series 4 possibly representing a thickened endometrium. Other: Low-level mesenteric and possibly left upper quadrant omental edema, nonspecific. Musculoskeletal: Lumbar spondylosis and degenerative disc disease most notable at L4-5 without overt impingement. IMPRESSION: 1. There several nonspecific findings including periportal edema, and low-grade mesenteric and left-sided omental edema. Conceivably this could be due to a low grade enteritis causing mild mesenteric edema as well as the patient's nausea and vomiting. 2. Several hepatic and renal lesions. 3. Nonobstructive nephrolithiasis. 4.  Aortoiliac atherosclerotic vascular disease. 5. There is abnormal central hypodensity in the uterus which could reflect an abnormally thickened endometrium. Pelvic sonography is recommended for further characterization. Electronically Signed   By: Van Clines M.D.   On: 08/21/2016 15:58    Procedures .Critical Care Performed by: Nona Dell Authorized by: Nona Dell   Critical care provider statement:    Critical care time (minutes):  40   Critical care was necessary to treat or prevent imminent or life-threatening deterioration of the following conditions:  Sepsis   Critical care was time spent personally by me on the following activities:  Blood draw for specimens, development of treatment plan with patient or surrogate, discussions with consultants, evaluation of patient's response to treatment, examination of patient, interpretation of cardiac output measurements, obtaining history from patient or surrogate, review of old charts, re-evaluation of patient's condition, pulse oximetry, ordering and review of radiographic studies, ordering and review of laboratory studies and ordering and performing treatments and  interventions    (including critical care time)  Medications Ordered in ED Medications  iopamidol (ISOVUE-300) 61 % injection (not administered)  piperacillin-tazobactam (ZOSYN) IVPB 3.375 g (not administered)  ondansetron (ZOFRAN) injection 4 mg (4 mg Intravenous Given 08/21/16 1258)  sodium chloride 0.9 % bolus 1,000 mL (0 mLs Intravenous Stopped 08/21/16 1501)  iopamidol (ISOVUE-300) 61 % injection 30 mL (30 mLs Oral Contrast Given 08/21/16 1332)  acetaminophen (TYLENOL) tablet 1,000 mg (1,000 mg Oral Given 08/21/16 1328)  piperacillin-tazobactam (ZOSYN) IVPB 3.375 g (0 g Intravenous Stopped 08/21/16 1550)  vancomycin (VANCOCIN) IVPB 1000 mg/200 mL premix (0 mg Intravenous Stopped 08/21/16 1550)  iopamidol (ISOVUE-300) 61 % injection 100 mL (100 mLs Intravenous Contrast Given 08/21/16 1520)     Initial Impression / Assessment and Plan / ED Course  I have reviewed the triage vital signs and the nursing notes.  Pertinent labs & imaging results that were available during my care of the patient were reviewed by me and considered in my medical decision making (see chart for details).  Clinical Course     Patient presents with chills, nausea and low back pain that started this morning. Also reports having intermittent pain to  her right shoulder blade for the past week which is relieved with Aleve. Vitals showed rectal temp 104.4, HR 124, remaining vitals stable. On exam patient is ill-appearing and shivering intermittently throughout exam. Lungs clear to auscultation bilaterally. No midline spinal tenderness. No neuro deficits. Abdominal exam benign. Discussed pt with Dr. Venora Maples who evaluated pt in the ED. Plan to order labs, CXR and CT abdomen. Concern for possible infected kidney stone vs cholecystitis. Pt given IVF and tylenol. WBC 11.7. Lactic acid 1.57. UA without signs of infection. Orders placed for sepsis protocol including broad spectrum antibiotics. CXR negative. CT abdomen showed findings  concerning for low grade enteritis, nonobstructive nephrolithiasis, otherwise no findings to suggest source of infection. Plan to admit pt for further management and evaluation of sepsis without known cause. Consulted hospitalist. Dr. Illene Silver agrees to admission. Discussed results and plan for admission with pt and family.   Final Clinical Impressions(s) / ED Diagnoses   Final diagnoses:  Sepsis, due to unspecified organism [facility])    New Prescriptions New Prescriptions   No medications on file     Nona Dell, [place] 08/21/16 [place], MD 08/21/16 1744

## 2016-08-22 ENCOUNTER — Inpatient Hospital Stay (HOSPITAL_COMMUNITY): Payer: Medicare Other

## 2016-08-22 DIAGNOSIS — R6510 Systemic inflammatory response syndrome (SIRS) of non-infectious origin without acute organ dysfunction: Secondary | ICD-10-CM | POA: Diagnosis not present

## 2016-08-22 DIAGNOSIS — M545 Low back pain: Secondary | ICD-10-CM

## 2016-08-22 DIAGNOSIS — A419 Sepsis, unspecified organism: Secondary | ICD-10-CM | POA: Diagnosis not present

## 2016-08-22 DIAGNOSIS — N200 Calculus of kidney: Secondary | ICD-10-CM | POA: Diagnosis not present

## 2016-08-22 DIAGNOSIS — J111 Influenza due to unidentified influenza virus with other respiratory manifestations: Secondary | ICD-10-CM | POA: Diagnosis not present

## 2016-08-22 DIAGNOSIS — R011 Cardiac murmur, unspecified: Secondary | ICD-10-CM | POA: Diagnosis not present

## 2016-08-22 DIAGNOSIS — M25511 Pain in right shoulder: Secondary | ICD-10-CM

## 2016-08-22 LAB — BASIC METABOLIC PANEL
ANION GAP: 6 (ref 5–15)
BUN: 16 mg/dL (ref 6–20)
CALCIUM: 7.9 mg/dL — AB (ref 8.9–10.3)
CO2: 24 mmol/L (ref 22–32)
CREATININE: 0.7 mg/dL (ref 0.44–1.00)
Chloride: 108 mmol/L (ref 101–111)
Glucose, Bld: 112 mg/dL — ABNORMAL HIGH (ref 65–99)
Potassium: 3.5 mmol/L (ref 3.5–5.1)
SODIUM: 138 mmol/L (ref 135–145)

## 2016-08-22 LAB — CBC
HCT: 32.3 % — ABNORMAL LOW (ref 36.0–46.0)
HEMOGLOBIN: 10.7 g/dL — AB (ref 12.0–15.0)
MCH: 30.4 pg (ref 26.0–34.0)
MCHC: 33.1 g/dL (ref 30.0–36.0)
MCV: 91.8 fL (ref 78.0–100.0)
PLATELETS: 129 10*3/uL — AB (ref 150–400)
RBC: 3.52 MIL/uL — AB (ref 3.87–5.11)
RDW: 14.2 % (ref 11.5–15.5)
WBC: 7.5 10*3/uL (ref 4.0–10.5)

## 2016-08-22 LAB — URINE CULTURE

## 2016-08-22 NOTE — Progress Notes (Signed)
PROGRESS NOTE    Tracy Bradford  [id number] DOB: 1934-08-17 DOA: 08/21/2016 PCP: Leonard Downing, MD   Brief Narrative:  Tracy Bradford is a 82 y.o. female with medical history significant of nephrolithiasis. Around 12Pm this afternoon, she started having sharp, lower back pain that was located throughout her lower back. The pain did not radiate. Nothing relieved her pain and she did not try anything to help. Pain spontaneously resolved while in the emergency department. Had back Pain overnight but improved now.   Assessment & Plan:   Principal Problem:   SIRS (systemic inflammatory response syndrome) (HCC) Active Problems:   Low back pain   Sinus tachycardia   Undiagnosed cardiac murmurs   Nephrolithiasis  SIRS Unknown source. Possible enteritis. Started on broad spectrum. -IV Vancomycin Discontinued;  C/w IV Zosyn -WBC improved  -blood culture showed NGTD < 24 hours -Urine culture with <10,000 CFU -Repeat CBC in AM and monitor for S/Sx of Infection  Back pain -Most Likely Radiculopathy as CT scan significant for degenerative disc disease -analgesics for pain prn -Improved.  Abnormal finding of uterus Seen on CT scan. -Pelvic ultrasound showed small Fibroid -Outpatient follow up with GYN  Cardiac murmur No prior history per patient -outpatient follow-up  Nephrolithiasis -Stable. Multiple non-obstructing calculi in both kdineys with R > L -Outpatient Urology F/U  Hepatic lesions -Stable from previous imaging. -Discussed with Patient -Outpatient Follow Up  Right Shoulder Pain -Check X-Ray of Right Shoulder  DVT prophylaxis: Lovenox Code Status: FULL CODE Family Communication: Discussed with Family at bedside Disposition Plan: Home in AM likely  Consultants:   None  Procedures: Right Shoulder X-Ray   Antimicrobials: IV Vancomycin 08/21/16 --> 08/22/16  IV Zosyn 08/21/16 -->    Subjective: Seen and examined at bedside and stated pain was  better. No more Nausea and no recurrent emesis. Was feeling better but complained of Right shoulder pain. Had back pain last night. No other complaints or concerns at this time.   Objective: Vitals:   08/21/16 1800 08/21/16 1842 08/21/16 2151 08/22/16 0519  BP: 122/62 (!) 137/59 (!) 125/56 124/77  Pulse: 111 (!) 115 (!) 109 89  Resp: 17 16 20 18   Temp:  99 F (37.2 C) (!) 100.5 F (38.1 C) 99.1 F (37.3 C)  TempSrc:  Oral Oral Oral  SpO2: 96% 99% 100% 98%  Weight:  52.9 kg (116 lb 10 oz)    Height:  5\' 2"  (1.575 m)      Intake/Output Summary (Last 24 hours) at 08/22/16 1226 Last data filed at 08/22/16 1100  Gross per 24 hour  Intake           2272.5 ml  Output                1 ml  Net           2271.5 ml   Filed Weights   08/21/16 1224 08/21/16 1842  Weight: 50.8 kg (112 lb) 52.9 kg (116 lb 10 oz)   Examination: Physical Exam:  Constitutional: WN/WD, NAD and appears calm and comfortable Eyes: Llids and conjunctivae normal, sclerae anicteric  ENMT: External Ears, Nose appear normal. Grossly normal hearing.  Neck: Appears normal, supple, no cervical masses, normal ROM, no appreciable thyromegaly Respiratory: Clear to auscultation bilaterally, no wheezing, rales, rhonchi or crackles. Normal respiratory effort and patient is not tachypenic. No accessory muscle use.  Cardiovascular: RRR, 2/6 Systolic Heart Murmur. No rubs / gallops. S1 and S2 auscultated. No extremity edema.  Abdomen: Soft, non-tender, non-distended. No masses palpated. No appreciable hepatosplenomegaly. Bowel sounds positive x4.  GU: Deferred. Musculoskeletal: No clubbing / cyanosis of digits/nails. No joint deformity upper and lower extremities.  Neurologic: CN 2-12 grossly intact with no focal deficits. Sensation intact in all 4 Extremities, DTR normal.  Psychiatric: Normal judgment and insight. Alert and oriented x 3. Normal mood and appropriate affect.   Data Reviewed: I have personally reviewed following  labs and imaging studies  CBC:  Recent Labs Lab 08/21/16 1248 08/22/16 0529  WBC 11.7* 7.5  NEUTROABS 10.9*  --   HGB 13.9 10.7*  HCT 40.9 32.3*  MCV 93.4 91.8  PLT 149* [id number]*   Basic Metabolic Panel:  Recent Labs Lab 08/21/16 1248 08/22/16 0529  NA 139 138  K 3.8 3.5  CL 102 108  CO2 24 24  GLUCOSE 152* 112*  BUN 21* 16  CREATININE 0.69 0.70  CALCIUM 9.1 7.9*   GFR: Estimated Creatinine Clearance: 45.1 mL/min (by C-G formula based on SCr of 0.7 mg/dL). Liver Function Tests:  Recent Labs Lab 08/21/16 1248  AST 37  ALT 34  ALKPHOS 52  BILITOT 1.4*  PROT 7.5  ALBUMIN 4.8    Recent Labs Lab 08/21/16 1248  LIPASE 22   No results for input(s): AMMONIA in the last 168 hours. Coagulation Profile: No results for input(s): INR, PROTIME in the last 168 hours. Cardiac Enzymes: No results for input(s): CKTOTAL, CKMB, CKMBINDEX, TROPONINI in the last 168 hours. BNP (last 3 results) No results for input(s): PROBNP in the last 8760 hours. HbA1C: No results for input(s): HGBA1C in the last 72 hours. CBG: No results for input(s): GLUCAP in the last 168 hours. Lipid Profile: No results for input(s): CHOL, HDL, LDLCALC, TRIG, CHOLHDL, LDLDIRECT in the last 72 hours. Thyroid Function Tests: No results for input(s): TSH, T4TOTAL, FREET4, T3FREE, THYROIDAB in the last 72 hours. Anemia Panel: No results for input(s): VITAMINB12, FOLATE, FERRITIN, TIBC, IRON, RETICCTPCT in the last 72 hours. Sepsis Labs:  Recent Labs Lab 08/21/16 1428  LATICACIDVEN 1.57  1.57    No results found for this or any previous visit (from the past 240 hour(s)).   Radiology Studies: Dg Chest 2 View  Result Date: 08/21/2016 CLINICAL DATA:  82 year old female with history of fever and lower abdominal pain. EXAM: CHEST  2 VIEW COMPARISON:  No priors. FINDINGS: Lung volumes are normal. No consolidative airspace disease. No pleural effusions. No pneumothorax. No pulmonary nodule or mass  noted. Pulmonary vasculature and the cardiomediastinal silhouette are within normal limits. Atherosclerosis in the thoracic aorta. IMPRESSION: 1.  No radiographic evidence of acute cardiopulmonary disease. 2. Aortic atherosclerosis. Electronically Signed   By: Vinnie Langton M.D.   On: 08/21/2016 13:39   Dg Shoulder Right  Result Date: 08/22/2016 CLINICAL DATA:  Right shoulder pain.  No known injury. EXAM: RIGHT SHOULDER - 2+ VIEW COMPARISON:  08/21/2016. FINDINGS: Diffuse osteopenia degenerative change. No acute or focal bony abnormality. No evidence of fracture dislocation. IMPRESSION: Acromioclavicular and glenohumeral degenerative change. Diffuse osteopenia. No acute bony abnormality. Electronically Signed   By: Marcello Moores  Register   On: 08/22/2016 11:56   US Transvaginal Non-ob  Result Date: 08/22/2016 CLINICAL DATA:  Followup abnormal CT. EXAM: TRANSABDOMINAL AND TRANSVAGINAL ULTRASOUND OF PELVIS TECHNIQUE: Both transabdominal and transvaginal ultrasound examinations of the pelvis were performed. Transabdominal technique was performed for global imaging of the pelvis including uterus, ovaries, adnexal regions, and pelvic cul-de-sac. It was necessary to proceed with endovaginal exam following the  transabdominal exam to visualize the uterus, endometrium and adnexal structures. COMPARISON:  None FINDINGS: Uterus Measurements: 5.9 x 2.5 x 4.4 cm. There is a fibroid within the left posterior uterine fundus measuring 1.9 x 0.6 x 1.0 cm Endometrium Thickness: 2.6 mm.  No focal abnormality visualized. Right ovary Measurements: Surgically absent. Left ovary Measurements: Surgically absent. Other findings Small amount of free fluid noted within the pelvis IMPRESSION: 1. Small uterine fibroid. 2. Small amount of free fluid noted within pelvis. Electronically Signed   By: Kerby Moors M.D.   On: 08/22/2016 10:11   US Pelvis Complete  Result Date: 08/22/2016 CLINICAL DATA:  Followup abnormal CT. EXAM:  TRANSABDOMINAL AND TRANSVAGINAL ULTRASOUND OF PELVIS TECHNIQUE: Both transabdominal and transvaginal ultrasound examinations of the pelvis were performed. Transabdominal technique was performed for global imaging of the pelvis including uterus, ovaries, adnexal regions, and pelvic cul-de-sac. It was necessary to proceed with endovaginal exam following the transabdominal exam to visualize the uterus, endometrium and adnexal structures. COMPARISON:  None FINDINGS: Uterus Measurements: 5.9 x 2.5 x 4.4 cm. There is a fibroid within the left posterior uterine fundus measuring 1.9 x 0.6 x 1.0 cm Endometrium Thickness: 2.6 mm.  No focal abnormality visualized. Right ovary Measurements: Surgically absent. Left ovary Measurements: Surgically absent. Other findings Small amount of free fluid noted within the pelvis IMPRESSION: 1. Small uterine fibroid. 2. Small amount of free fluid noted within pelvis. Electronically Signed   By: Kerby Moors M.D.   On: 08/22/2016 10:11   Ct Abdomen Pelvis W Contrast  Result Date: 08/21/2016 CLINICAL DATA:  Dizziness; n/v; low back pain since 1130 EXAM: CT ABDOMEN AND PELVIS WITH CONTRAST TECHNIQUE: Multidetector CT imaging of the abdomen and pelvis was performed using the standard protocol following bolus administration of intravenous contrast. CONTRAST:  132mL ISOVUE-300 IOPAMIDOL (ISOVUE-300) INJECTION 61% COMPARISON:  02/11/2013. FINDINGS: Lower chest: Mild scarring in the right lower lobe and dependent subsegmental atelectasis in both lower lobes. Hepatobiliary: Mild periportal edema. 2.0 by 1.5 cm fluid density lesion of the posterior right hepatic lobe on image 26/2, stable. Several other tiny hepatic hypodensities are stable from prior and technically too small to characterize although statistically likely to be benign. Distal CBD 7 mm, within normal limits for age, but with abrupt truncation in the ampulla. Pancreas: Unremarkable Spleen: Unremarkable Adrenals/Urinary Tract:  Adrenal glands normal. Stable right mid kidney fluid density lesion, compatible with cyst. Hypodense lesion of the left mid kidney is technically nonspecific although statistically likely to represent a small cyst with volume averaging. Bilateral small carry renal cysts are present. Right kidney upper pole 6 mm nonobstructive renal calculus, image 32/2. Right mid kidney 1-2 mm calculus. Right kidney lower pole 3 mm calculus. Left kidney lower pole 5 mm linear calculus on image 65/3. No hydronephrosis or hydroureter.  Urinary bladder unremarkable. Stomach/Bowel: Unremarkable.  Appendix poorly seen. Vascular/Lymphatic: Aortoiliac atherosclerotic vascular disease. No pathologic adenopathy identified. Reproductive: Central hypodensity in the uterus measuring up to 1.5 cm in thickness on image 76 of series 4 possibly representing a thickened endometrium. Other: Low-level mesenteric and possibly left upper quadrant omental edema, nonspecific. Musculoskeletal: Lumbar spondylosis and degenerative disc disease most notable at L4-5 without overt impingement. IMPRESSION: 1. There several nonspecific findings including periportal edema, and low-grade mesenteric and left-sided omental edema. Conceivably this could be due to a low grade enteritis causing mild mesenteric edema as well as the patient's nausea and vomiting. 2. Several hepatic and renal lesions. 3. Nonobstructive nephrolithiasis. 4.  Aortoiliac atherosclerotic vascular  disease. 5. There is abnormal central hypodensity in the uterus which could reflect an abnormally thickened endometrium. Pelvic sonography is recommended for further characterization. Electronically Signed   By: Van Clines M.D.   On: 08/21/2016 15:58   Scheduled Meds: . enoxaparin (LOVENOX) injection  40 mg Subcutaneous Q24H  . piperacillin-tazobactam (ZOSYN)  IV  3.375 g Intravenous Q8H   Continuous Infusions: . sodium chloride 75 mL/hr at 08/21/16 1902    LOS: 1 day   Kerney Elbe, DO [facility] Pager [phone]  If 7PM-7AM, please contact night-coverage [email or web address] Password [facility] 08/22/2016, 12:26 PM

## 2016-08-23 DIAGNOSIS — R011 Cardiac murmur, unspecified: Secondary | ICD-10-CM | POA: Diagnosis not present

## 2016-08-23 DIAGNOSIS — N200 Calculus of kidney: Secondary | ICD-10-CM | POA: Diagnosis not present

## 2016-08-23 DIAGNOSIS — A419 Sepsis, unspecified organism: Secondary | ICD-10-CM | POA: Diagnosis not present

## 2016-08-23 DIAGNOSIS — J111 Influenza due to unidentified influenza virus with other respiratory manifestations: Secondary | ICD-10-CM | POA: Diagnosis not present

## 2016-08-23 DIAGNOSIS — J101 Influenza due to other identified influenza virus with other respiratory manifestations: Secondary | ICD-10-CM

## 2016-08-23 DIAGNOSIS — M545 Low back pain: Secondary | ICD-10-CM | POA: Diagnosis not present

## 2016-08-23 DIAGNOSIS — R6510 Systemic inflammatory response syndrome (SIRS) of non-infectious origin without acute organ dysfunction: Secondary | ICD-10-CM | POA: Diagnosis not present

## 2016-08-23 LAB — CBC WITH DIFFERENTIAL/PLATELET
BASOS ABS: 0 10*3/uL (ref 0.0–0.1)
Basophils Relative: 0 %
EOS ABS: 0 10*3/uL (ref 0.0–0.7)
EOS PCT: 0 %
HCT: 33.3 % — ABNORMAL LOW (ref 36.0–46.0)
HEMOGLOBIN: 10.9 g/dL — AB (ref 12.0–15.0)
LYMPHS ABS: 1.2 10*3/uL (ref 0.7–4.0)
LYMPHS PCT: 26 %
MCH: 30.3 pg (ref 26.0–34.0)
MCHC: 32.7 g/dL (ref 30.0–36.0)
MCV: 92.5 fL (ref 78.0–100.0)
Monocytes Absolute: 0.5 10*3/uL (ref 0.1–1.0)
Monocytes Relative: 11 %
NEUTROS PCT: 63 %
Neutro Abs: 2.8 10*3/uL (ref 1.7–7.7)
PLATELETS: 118 10*3/uL — AB (ref 150–400)
RBC: 3.6 MIL/uL — AB (ref 3.87–5.11)
RDW: 14.1 % (ref 11.5–15.5)
WBC: 4.5 10*3/uL (ref 4.0–10.5)

## 2016-08-23 LAB — COMPREHENSIVE METABOLIC PANEL
ALK PHOS: 31 U/L — AB (ref 38–126)
ALT: 41 U/L (ref 14–54)
AST: 62 U/L — AB (ref 15–41)
Albumin: 3.4 g/dL — ABNORMAL LOW (ref 3.5–5.0)
Anion gap: 7 (ref 5–15)
BUN: 15 mg/dL (ref 6–20)
CALCIUM: 8.2 mg/dL — AB (ref 8.9–10.3)
CHLORIDE: 106 mmol/L (ref 101–111)
CO2: 26 mmol/L (ref 22–32)
CREATININE: 0.74 mg/dL (ref 0.44–1.00)
GFR calc Af Amer: >60 mL/min (ref >60)
GFR calc non Af Amer: >60 mL/min (ref >60)
GLUCOSE: 94 mg/dL (ref 65–99)
Potassium: 3.3 mmol/L — ABNORMAL LOW (ref 3.5–5.1)
SODIUM: 139 mmol/L (ref 135–145)
Total Bilirubin: 0.7 mg/dL (ref 0.3–1.2)
Total Protein: 5.6 g/dL — ABNORMAL LOW (ref 6.5–8.1)

## 2016-08-23 LAB — INFLUENZA PANEL BY PCR (TYPE A & B)
Influenza A By PCR: POSITIVE — AB
Influenza B By PCR: NEGATIVE

## 2016-08-23 MED ORDER — POTASSIUM CHLORIDE CRYS ER 20 MEQ PO TBCR
40.0000 meq | EXTENDED_RELEASE_TABLET | Freq: Two times a day (BID) | ORAL | Status: DC
  Administered 2016-08-23: 40 meq via ORAL
  Filled 2016-08-23: qty 2

## 2016-08-23 MED ORDER — HYDROCODONE-ACETAMINOPHEN 5-325 MG PO TABS: 1.0000 | ORAL_TABLET | Freq: Four times a day (QID) | ORAL | 0 refills | Status: DC | PRN

## 2016-08-23 MED ORDER — POLYETHYLENE GLYCOL 3350 17 G PO PACK: 17.0000 g | PACK | Freq: Every day | ORAL | 0 refills | Status: DC | PRN

## 2016-08-23 NOTE — Discharge Summary (Signed)
Physician Discharge Summary  Tracy Bradford [id number] DOB: 25-Jan-1935 DOA: 08/21/2016  PCP: Leonard Downing, MD  Admit date: 08/21/2016 Discharge date: 08/23/2016  Admitted From: Home Disposition:  Home  Recommendations for Outpatient Follow-up:  1. Follow up with PCP in 1-2 weeks 2. Follow up with Gynecology in 1 week to discuss Uterine Fibroid 3. Follow up with Orthopedics for Right Shoulder Evaluation 4. Follow up with Urology for Renal Kidney Stones 5. Please obtain BMP/CBC in one week 6. Please follow up on the following pending results: Influenza PCR + for Influenza A  Home Health: No Equipment/Devices: None  Discharge Condition: Stable CODE STATUS: FULL CODE Diet recommendation: Heart Healthy   Brief/Interim Summary: Tracy Bradford a 82 y.o.femalewith medical history significant of nephrolithiasis. Around 12Pm on the afternoon of admission, she started having sharp, lower back pain that was located throughout her lower back. The pain did not radiate. Nothing relieved her pain and she did not try anything to help. Pain spontaneously resolved while in the emergency department. Worked up and admitted for SIRS and back pain. Underwent CT Scan which showed Nephrolithiasis and Liver Lesions that were stable since 2014. Patient was not found to have any source of Infection so Abx were Discontinued. Patient was told to follow up with PCP, Urology, Gynecology, and Orthopedics as an outpatient. At this time was deemed medically stable to be D/C'd Home.  Discharge Diagnoses:  Principal Problem:   SIRS (systemic inflammatory response syndrome) (HCC) Active Problems:   Low back pain   Sinus tachycardia   Undiagnosed cardiac murmurs   Nephrolithiasis  SIRS Unknown source initially. Possible enteritis. Was found to have Flu A Positive.  -WBC improved  -blood culture showed NGTD < 24 hours -Urine culture with <10,000 CFU -Influenza PCR Positive- Patient informed via Telephone;  Out of Window for TamiFlu; Continue supportive Care -Repeat CBC as an outpatient  -Has Regularly scheduled Appointment with PCP in AM  Back pain -Most Likely Radiculopathy as CT scan significant for degenerative disc disease -analgesics for pain prn -Improved.  Abnormal finding of uterus Seen on CT scan. -Pelvic ultrasound showed small Fibroid -Outpatient follow up with GYN  Cardiac murmur No prior history per patient -outpatient follow-up with PCP to refer for Cardiac Work Up  Nephrolithiasis -Stable. Multiple non-obstructing calculi in both kdineys with R > L -Outpatient Urology F/U  Hepatic lesions -Stable from previous imaging. -Discussed with Patient -Outpatient Follow Up with PCP  Right Shoulder Pain -X-Ray of Right Shoulder showed Acromioclavicular and glenohumeral degenerative change. Diffuse osteopenia. No acute bony abnormality. -Outpatient follow up with Orthopedics  Discharge Instructions  Discharge Instructions    Call MD for:  difficulty breathing, headache or visual disturbances    Complete by:  As directed    Call MD for:  persistant dizziness or light-headedness    Complete by:  As directed    Call MD for:  persistant nausea and vomiting    Complete by:  As directed    Call MD for:  severe uncontrolled pain    Complete by:  As directed    Call MD for:  temperature >100.4    Complete by:  As directed    Diet - low sodium heart healthy    Complete by:  As directed    Discharge instructions    Complete by:  As directed    Follow up with PCP, Gynecology, Urology, and Orthopedics as an outpatient. Take medications as prescribed. If symptoms change or worsen please go to  PCP or come to ER for re-evaluation.   Increase activity slowly    Complete by:  As directed      Allergies as of 08/23/2016      Reactions   Lemon Flavor Nausea And Vomiting   Lemons       Medication List    STOP taking these medications   ibuprofen 200 MG  tablet Commonly known as:  ADVIL,MOTRIN     TAKE these medications   HYDROcodone-acetaminophen 5-325 MG tablet Commonly known as:  NORCO/VICODIN Take 1-2 tablets by mouth every 6 (six) hours as needed for moderate pain.   polyethylene glycol packet Commonly known as:  MIRALAX / GLYCOLAX Take 17 g by mouth daily as needed for mild constipation.       Allergies  Allergen Reactions  . Lemon Flavor Nausea And Vomiting    Lemons    Consultations:  None   Procedures/Studies: Dg Chest 2 View  Result Date: 08/21/2016 CLINICAL DATA:  82 year old female with history of fever and lower abdominal pain. EXAM: CHEST  2 VIEW COMPARISON:  No priors. FINDINGS: Lung volumes are normal. No consolidative airspace disease. No pleural effusions. No pneumothorax. No pulmonary nodule or mass noted. Pulmonary vasculature and the cardiomediastinal silhouette are within normal limits. Atherosclerosis in the thoracic aorta. IMPRESSION: 1.  No radiographic evidence of acute cardiopulmonary disease. 2. Aortic atherosclerosis. Electronically Signed   By: Vinnie Langton M.D.   On: 08/21/2016 13:39   Dg Shoulder Right  Result Date: 08/22/2016 CLINICAL DATA:  Right shoulder pain.  No known injury. EXAM: RIGHT SHOULDER - 2+ VIEW COMPARISON:  08/21/2016. FINDINGS: Diffuse osteopenia degenerative change. No acute or focal bony abnormality. No evidence of fracture dislocation. IMPRESSION: Acromioclavicular and glenohumeral degenerative change. Diffuse osteopenia. No acute bony abnormality. Electronically Signed   By: Marcello Moores  Register   On: 08/22/2016 11:56   US Transvaginal Non-ob  Result Date: 08/22/2016 CLINICAL DATA:  Followup abnormal CT. EXAM: TRANSABDOMINAL AND TRANSVAGINAL ULTRASOUND OF PELVIS TECHNIQUE: Both transabdominal and transvaginal ultrasound examinations of the pelvis were performed. Transabdominal technique was performed for global imaging of the pelvis including uterus, ovaries, adnexal regions,  and pelvic cul-de-sac. It was necessary to proceed with endovaginal exam following the transabdominal exam to visualize the uterus, endometrium and adnexal structures. COMPARISON:  None FINDINGS: Uterus Measurements: 5.9 x 2.5 x 4.4 cm. There is a fibroid within the left posterior uterine fundus measuring 1.9 x 0.6 x 1.0 cm Endometrium Thickness: 2.6 mm.  No focal abnormality visualized. Right ovary Measurements: Surgically absent. Left ovary Measurements: Surgically absent. Other findings Small amount of free fluid noted within the pelvis IMPRESSION: 1. Small uterine fibroid. 2. Small amount of free fluid noted within pelvis. Electronically Signed   By: Kerby Moors M.D.   On: 08/22/2016 10:11   US Pelvis Complete  Result Date: 08/22/2016 CLINICAL DATA:  Followup abnormal CT. EXAM: TRANSABDOMINAL AND TRANSVAGINAL ULTRASOUND OF PELVIS TECHNIQUE: Both transabdominal and transvaginal ultrasound examinations of the pelvis were performed. Transabdominal technique was performed for global imaging of the pelvis including uterus, ovaries, adnexal regions, and pelvic cul-de-sac. It was necessary to proceed with endovaginal exam following the transabdominal exam to visualize the uterus, endometrium and adnexal structures. COMPARISON:  None FINDINGS: Uterus Measurements: 5.9 x 2.5 x 4.4 cm. There is a fibroid within the left posterior uterine fundus measuring 1.9 x 0.6 x 1.0 cm Endometrium Thickness: 2.6 mm.  No focal abnormality visualized. Right ovary Measurements: Surgically absent. Left ovary Measurements: Surgically absent. Other findings  Small amount of free fluid noted within the pelvis IMPRESSION: 1. Small uterine fibroid. 2. Small amount of free fluid noted within pelvis. Electronically Signed   By: Kerby Moors M.D.   On: 08/22/2016 10:11   Ct Abdomen Pelvis W Contrast  Result Date: 08/21/2016 CLINICAL DATA:  Dizziness; n/v; low back pain since 1130 EXAM: CT ABDOMEN AND PELVIS WITH CONTRAST TECHNIQUE:  Multidetector CT imaging of the abdomen and pelvis was performed using the standard protocol following bolus administration of intravenous contrast. CONTRAST:  149mL ISOVUE-300 IOPAMIDOL (ISOVUE-300) INJECTION 61% COMPARISON:  02/11/2013. FINDINGS: Lower chest: Mild scarring in the right lower lobe and dependent subsegmental atelectasis in both lower lobes. Hepatobiliary: Mild periportal edema. 2.0 by 1.5 cm fluid density lesion of the posterior right hepatic lobe on image 26/2, stable. Several other tiny hepatic hypodensities are stable from prior and technically too small to characterize although statistically likely to be benign. Distal CBD 7 mm, within normal limits for age, but with abrupt truncation in the ampulla. Pancreas: Unremarkable Spleen: Unremarkable Adrenals/Urinary Tract: Adrenal glands normal. Stable right mid kidney fluid density lesion, compatible with cyst. Hypodense lesion of the left mid kidney is technically nonspecific although statistically likely to represent a small cyst with volume averaging. Bilateral small carry renal cysts are present. Right kidney upper pole 6 mm nonobstructive renal calculus, image 32/2. Right mid kidney 1-2 mm calculus. Right kidney lower pole 3 mm calculus. Left kidney lower pole 5 mm linear calculus on image 65/3. No hydronephrosis or hydroureter.  Urinary bladder unremarkable. Stomach/Bowel: Unremarkable.  Appendix poorly seen. Vascular/Lymphatic: Aortoiliac atherosclerotic vascular disease. No pathologic adenopathy identified. Reproductive: Central hypodensity in the uterus measuring up to 1.5 cm in thickness on image 76 of series 4 possibly representing a thickened endometrium. Other: Low-level mesenteric and possibly left upper quadrant omental edema, nonspecific. Musculoskeletal: Lumbar spondylosis and degenerative disc disease most notable at L4-5 without overt impingement. IMPRESSION: 1. There several nonspecific findings including periportal edema, and  low-grade mesenteric and left-sided omental edema. Conceivably this could be due to a low grade enteritis causing mild mesenteric edema as well as the patient's nausea and vomiting. 2. Several hepatic and renal lesions. 3. Nonobstructive nephrolithiasis. 4.  Aortoiliac atherosclerotic vascular disease. 5. There is abnormal central hypodensity in the uterus which could reflect an abnormally thickened endometrium. Pelvic sonography is recommended for further characterization. Electronically Signed   By: Van Clines M.D.   On: 08/21/2016 15:58   Subjective: Seen and examined at bedside and was improved. No complaints or concerns at this time. Has follow up with PCP in Am. **Note Influenza PCR resulted and patient  Flu A Positive after patient was discharged. Called and informed patient of findings and discussed with her since she was out of 48 hour window that she would not require Tamiflu. She informed me she will discuss with PCP in Am.**  Discharge Exam: Vitals:   08/22/16 2123 08/23/16 0445  BP: (!) 147/64 132/62  Pulse: 96 64  Resp: 16 17  Temp: 100 F (37.8 C) 98 F (36.7 C)   Vitals:   08/22/16 0519 08/22/16 1400 08/22/16 2123 08/23/16 0445  BP: 124/77 132/74 (!) 147/64 132/62  Pulse: 89 98 96 64  Resp: 18  16 17   Temp: 99.1 F (37.3 C)  100 F (37.8 C) 98 F (36.7 C)  TempSrc: Oral  Oral Oral  SpO2: 98% 96% 98% 98%  Weight:      Height:       General: Pt  is alert, awake, not in acute distress Cardiovascular: RRR, S1/S2 +, no rubs, no gallops Respiratory: CTA bilaterally, no wheezing, no rhonchi Abdominal: Soft, NT, ND, bowel sounds + Extremities: no edema, no cyanosis  The results of significant diagnostics from this hospitalization (including imaging, microbiology, ancillary and laboratory) are listed below for reference.    Microbiology: Recent Results (from the past 240 hour(s))  Urine culture     Status: Abnormal   Collection Time: 08/21/16 12:11 PM  Result  Value Ref Range Status   Specimen Description URINE, RANDOM  Final   Special Requests NONE  Final   Culture (A)  Final    <10,000 COLONIES/mL INSIGNIFICANT GROWTH Performed at [facility], [address], [place], [place] [address]    Report Status 08/22/2016 FINAL  Final  Blood Culture (routine x 2)     Status: None (Preliminary result)   Collection Time: 08/21/16  2:22 PM  Result Value Ref Range Status   Specimen Description BLOOD LEFT ANTECUBITAL  Final   Special Requests BOTTLES DRAWN AEROBIC AND ANAEROBIC 5ML  Final   Culture   Final    NO GROWTH < 24 HOURS Performed at [facility], [place] [address] [place], [place] [address]    Report Status PENDING  Incomplete  Blood Culture (routine x 2)     Status: None (Preliminary result)   Collection Time: 08/21/16  2:25 PM  Result Value Ref Range Status   Specimen Description BLOOD RIGHT HAND  Final   Special Requests BOTTLES DRAWN AEROBIC ONLY 5CC  Final   Culture   Final    NO GROWTH < 24 HOURS Performed at [facility], [place] [address], [place], [place] [address]    Report Status PENDING  Incomplete     Labs: BNP (last 3 results) No results for input(s): BNP in the last 8760 hours. Basic Metabolic Panel:  Recent Labs Lab 08/21/16 1248 08/22/16 0529 08/23/16 0518  NA 139 138 139  K 3.8 3.5 3.3*  CL 102 108 106  CO2 24 24 26   GLUCOSE 152* 112* 94  BUN 21* 16 15  CREATININE 0.69 0.70 0.74  CALCIUM 9.1 7.9* 8.2*   Liver Function Tests:  Recent Labs Lab 08/21/16 1248 08/23/16 0518  AST 37 62*  ALT 34 41  ALKPHOS 52 31*  BILITOT 1.4* 0.7  PROT 7.5 5.6*  ALBUMIN 4.8 3.4*    Recent Labs Lab 08/21/16 1248  LIPASE 22   No results for input(s): AMMONIA in the last 168 hours. CBC:  Recent Labs Lab 08/21/16 1248 08/22/16 0529 08/23/16 0518  WBC 11.7* 7.5 4.5  NEUTROABS 10.9*  --  2.8  HGB 13.9 10.7* 10.9*  HCT 40.9 32.3* 33.3*  MCV 93.4 91.8 92.5  PLT 149* 129* 118*   Cardiac  Enzymes: No results for input(s): CKTOTAL, CKMB, CKMBINDEX, TROPONINI in the last 168 hours. BNP: Invalid input(s): POCBNP CBG: No results for input(s): GLUCAP in the last 168 hours. D-Dimer No results for input(s): DDIMER in the last 72 hours. Hgb A1c No results for input(s): HGBA1C in the last 72 hours. Lipid Profile No results for input(s): CHOL, HDL, LDLCALC, TRIG, CHOLHDL, LDLDIRECT in the last 72 hours. Thyroid function studies No results for input(s): TSH, T4TOTAL, T3FREE, THYROIDAB in the last 72 hours.  Invalid input(s): FREET3 Anemia work up No results for input(s): VITAMINB12, FOLATE, FERRITIN, TIBC, IRON, RETICCTPCT in the last 72 hours. Urinalysis    Component Value Date/Time  COLORURINE YELLOW 08/21/2016 [place] 08/21/2016 1211   LABSPEC 1.013 08/21/2016 1211   PHURINE 7.0 08/21/2016 1211   GLUCOSEU NEGATIVE 08/21/2016 1211   HGBUR SMALL (A) 08/21/2016 1211   BILIRUBINUR NEGATIVE 08/21/2016 1211   KETONESUR 20 (A) 08/21/2016 1211   PROTEINUR NEGATIVE 08/21/2016 1211   UROBILINOGEN 0.2 02/11/2013 0413   NITRITE NEGATIVE 08/21/2016 1211   LEUKOCYTESUR NEGATIVE 08/21/2016 1211   Sepsis Labs Invalid input(s): PROCALCITONIN,  WBC,  LACTICIDVEN Microbiology Recent Results (from the past 240 hour(s))  Urine culture     Status: Abnormal   Collection Time: 08/21/16 12:11 PM  Result Value Ref Range Status   Specimen Description URINE, RANDOM  Final   Special Requests NONE  Final   Culture (A)  Final    <10,000 COLONIES/mL INSIGNIFICANT GROWTH Performed at [facility], [place] [address], [place], [place] [address]    Report Status 08/22/2016 FINAL  Final  Blood Culture (routine x 2)     Status: None (Preliminary result)   Collection Time: 08/21/16  2:22 PM  Result Value Ref Range Status   Specimen Description BLOOD LEFT ANTECUBITAL  Final   Special Requests BOTTLES DRAWN AEROBIC AND ANAEROBIC 5ML  Final   Culture   Final    NO GROWTH <  24 HOURS Performed at [facility], [place] [address] [place], [place] [address]    Report Status PENDING  Incomplete  Blood Culture (routine x 2)     Status: None (Preliminary result)   Collection Time: 08/21/16  2:25 PM  Result Value Ref Range Status   Specimen Description BLOOD RIGHT HAND  Final   Special Requests BOTTLES DRAWN AEROBIC ONLY 5CC  Final   Culture   Final    NO GROWTH < 24 HOURS Performed at [facility], [place] [address] [place], [place] [address]    Report Status PENDING  Incomplete   Time coordinating discharge: Over 30 minutes  SIGNED:  Kerney Elbe, DO [facility] 08/23/2016, 2:40 PM Pager [phone]  If 7PM-7AM, please contact night-coverage [email or web address] Password [email or web address]

## 2016-08-26 LAB — CULTURE, BLOOD (ROUTINE X 2)
Culture: NO GROWTH
Culture: NO GROWTH

## 2016-09-05 ENCOUNTER — Ambulatory Visit
Admission: RE | Admit: 2016-09-05 | Discharge: 2016-09-05 | Disposition: A | Discharge status: Home / Self Care | Payer: Medicare Other | Source: Ambulatory Visit | Attending: Family Medicine | Admitting: Family Medicine

## 2016-09-05 DIAGNOSIS — Z1231 Encounter for screening mammogram for malignant neoplasm of breast: Secondary | ICD-10-CM

## 2017-08-20 ENCOUNTER — Other Ambulatory Visit: Payer: Self-pay | Admitting: Family Medicine

## 2017-08-20 DIAGNOSIS — Z1231 Encounter for screening mammogram for malignant neoplasm of breast: Secondary | ICD-10-CM

## 2017-09-09 ENCOUNTER — Ambulatory Visit
Admission: RE | Admit: 2017-09-09 | Discharge: 2017-09-09 | Disposition: A | Discharge status: Home / Self Care | Payer: Medicare Other | Source: Ambulatory Visit | Attending: Family Medicine | Admitting: Family Medicine

## 2017-09-09 DIAGNOSIS — Z1231 Encounter for screening mammogram for malignant neoplasm of breast: Secondary | ICD-10-CM

## 2018-08-07 ENCOUNTER — Other Ambulatory Visit: Payer: Self-pay | Admitting: Family Medicine

## 2018-08-07 DIAGNOSIS — Z1231 Encounter for screening mammogram for malignant neoplasm of breast: Secondary | ICD-10-CM

## 2018-09-10 ENCOUNTER — Ambulatory Visit
Admission: RE | Admit: 2018-09-10 | Discharge: 2018-09-10 | Disposition: A | Discharge status: Home / Self Care | Payer: Medicare Other | Source: Ambulatory Visit | Attending: Family Medicine | Admitting: Family Medicine

## 2018-09-10 ENCOUNTER — Other Ambulatory Visit: Payer: Self-pay | Admitting: Family Medicine

## 2018-09-10 DIAGNOSIS — Z1231 Encounter for screening mammogram for malignant neoplasm of breast: Secondary | ICD-10-CM

## 2018-09-10 DIAGNOSIS — E2839 Other primary ovarian failure: Secondary | ICD-10-CM

## 2018-11-06 ENCOUNTER — Other Ambulatory Visit: Payer: Medicare Other

## 2019-08-20 ENCOUNTER — Other Ambulatory Visit: Payer: Self-pay | Admitting: Family Medicine

## 2019-08-20 DIAGNOSIS — Z1231 Encounter for screening mammogram for malignant neoplasm of breast: Secondary | ICD-10-CM

## 2019-09-13 ENCOUNTER — Ambulatory Visit: Payer: Medicare Other

## 2019-09-24 ENCOUNTER — Ambulatory Visit: Payer: Medicare Other

## 2019-09-28 ENCOUNTER — Ambulatory Visit: Payer: Medicare Other

## 2019-10-29 ENCOUNTER — Other Ambulatory Visit: Payer: Self-pay

## 2019-10-29 ENCOUNTER — Ambulatory Visit
Admission: RE | Admit: 2019-10-29 | Discharge: 2019-10-29 | Disposition: A | Discharge status: Home / Self Care | Payer: Medicare Other | Source: Ambulatory Visit | Attending: Family Medicine | Admitting: Family Medicine

## 2019-10-29 DIAGNOSIS — Z1231 Encounter for screening mammogram for malignant neoplasm of breast: Secondary | ICD-10-CM

## 2020-05-10 ENCOUNTER — Ambulatory Visit: Payer: Medicare Other

## 2020-06-14 ENCOUNTER — Other Ambulatory Visit (HOSPITAL_COMMUNITY): Payer: Self-pay | Admitting: Family Medicine

## 2020-06-14 DIAGNOSIS — R55 Syncope and collapse: Secondary | ICD-10-CM

## 2020-06-14 DIAGNOSIS — R030 Elevated blood-pressure reading, without diagnosis of hypertension: Secondary | ICD-10-CM

## 2020-06-24 ENCOUNTER — Other Ambulatory Visit (HOSPITAL_COMMUNITY): Payer: Self-pay | Admitting: Family Medicine

## 2020-06-24 DIAGNOSIS — R55 Syncope and collapse: Secondary | ICD-10-CM

## 2020-07-11 ENCOUNTER — Ambulatory Visit (HOSPITAL_BASED_OUTPATIENT_CLINIC_OR_DEPARTMENT_OTHER): Payer: Medicare Other

## 2020-07-11 ENCOUNTER — Ambulatory Visit (HOSPITAL_COMMUNITY)
Admission: RE | Admit: 2020-07-11 | Discharge: 2020-07-11 | Disposition: A | Discharge status: Home / Self Care | Payer: Medicare Other | Source: Ambulatory Visit | Attending: Cardiology | Admitting: Cardiology

## 2020-07-11 ENCOUNTER — Other Ambulatory Visit: Payer: Self-pay

## 2020-07-11 ENCOUNTER — Other Ambulatory Visit (HOSPITAL_COMMUNITY): Payer: Self-pay | Admitting: Family Medicine

## 2020-07-11 DIAGNOSIS — R030 Elevated blood-pressure reading, without diagnosis of hypertension: Secondary | ICD-10-CM

## 2020-07-11 DIAGNOSIS — R55 Syncope and collapse: Secondary | ICD-10-CM

## 2020-07-11 LAB — ECHOCARDIOGRAM COMPLETE
Area-P 1/2: 3.77 cm2
S' Lateral: 2.9 cm

## 2020-11-09 ENCOUNTER — Telehealth: Payer: Self-pay | Admitting: Neurology

## 2020-11-09 ENCOUNTER — Encounter: Payer: Self-pay | Admitting: Neurology

## 2020-11-09 ENCOUNTER — Ambulatory Visit: Payer: Medicare Other | Admitting: Neurology

## 2020-11-09 ENCOUNTER — Other Ambulatory Visit: Payer: Self-pay

## 2020-11-09 VITALS — BP 120/80 | HR 93 | Ht 62.5 in | Wt 117.0 lb

## 2020-11-09 DIAGNOSIS — R413 Other amnesia: Secondary | ICD-10-CM

## 2020-11-09 DIAGNOSIS — E559 Vitamin D deficiency, unspecified: Secondary | ICD-10-CM | POA: Diagnosis not present

## 2020-11-09 NOTE — Telephone Encounter (Signed)
[employer] medicare order sent to GI. No auth they will reach out to the patient to schedule.  

## 2020-11-09 NOTE — Progress Notes (Signed)
[facility]  PATIENT: Tracy Bradford DOB: [birth year]  REFERRING DOCTOR OR PCP: Horald Pollen, MD SOURCE: Patient, notes from PCP  _________________________________   HISTORICAL  CHIEF COMPLAINT:  Chief Complaint  Patient presents with  . New Patient (Initial Visit)    RM 30 w/ daughter, Mickel Baas. Paper referral from Doylene Canning, MD for memory.    HISTORY OF PRESENT ILLNESS:  I had the pleasure seeing your patient, Tracy Bradford, at [facility] for neurologic consultation regarding her memory loss and other cognitive concerns.  She is an 86 year old woman with memory loss.  She and her daughter disagree on the extent of this issue.  She is not aware of a problem but her family has noted difficulty.  She feels memory is fine and that she is not forgetful.  She states she is doing her checkbook and taxes.   She states her driving is fine.  She takes she has not left the oven/stove on or had other household issues.     Her daughter Mickel Baas notes she has has STM issues.   Ms. Manter repeats her conversations.   She is forgetful.  She often does not remember what she needs to do.  Mickel Baas helps her mom with financial tasks.    She makes sure her mpm's checks are mailed.    She notes her mom is not driving as much.    Ms. Feliz husband will do longer driving but Michelena does some short trips.      Ms. Boxley sleeps well most nights (10 pm - 6:30 am).    She does not snore.   She denies depression or anxiety.  She is in excellent health and is physically active.  She has been on donepezil and memantine though is not certain how long she has been on these medications.  She does not know if they have helped any.  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    She spends her day knitting, gardening and reading.  She also tries to walk daily.   She watches the grand kids  REVIEW OF SYSTEMS: Constitutional: No fevers, chills, sweats, or change in appetite Eyes: No visual changes, double vision, eye pain Ear, nose and throat: No hearing loss, ear pain, nasal congestion, sore throat Cardiovascular: No chest pain, palpitations Respiratory: No shortness of breath at rest or with exertion.   No wheezes GastrointestinaI: No nausea, vomiting, diarrhea, abdominal pain, fecal incontinence Genitourinary: No dysuria, urinary retention or frequency.  No nocturia. Musculoskeletal: No neck pain, back pain Integumentary: No rash, pruritus, skin lesions Neurological: as above Psychiatric: No depression at this time.  No anxiety Endocrine: No palpitations, diaphoresis, change in appetite, change in weigh or increased thirst Hematologic/Lymphatic: No anemia, purpura, petechiae. Allergic/Immunologic: No itchy/runny eyes, nasal congestion, recent allergic reactions, rashes  ALLERGIES: Allergies  Allergen Reactions  . Lemon Flavor Nausea  And Vomiting    Lemons     HOME MEDICATIONS:  Current Outpatient Medications:  .  donepezil (ARICEPT) 10 MG tablet, Take 10 mg by mouth at bedtime., Disp: , Rfl:  .  memantine (NAMENDA) 10 MG tablet, Take 10 mg by mouth 2 (two) times daily., Disp: , Rfl:   PAST MEDICAL HISTORY: Past Medical History:  Diagnosis Date  . Kidney stones     PAST SURGICAL HISTORY: Past Surgical History:  Procedure Laterality Date  . kidney stone removal    . ROBOTIC ASSISTED BILATERAL SALPINGO  OOPHERECTOMY Bilateral 04/13/2013   Procedure: ROBOTIC ASSISTED BILATERAL SALPINGO OOPHORECTOMY;Lysis of Bowel adhesions,myomectomy, removal of endocervical polyp;  Surgeon: Lovenia Kim, MD;  Location: [facility];  Service: Gynecology;  Laterality: Bilateral;  . TONSILLECTOMY      FAMILY HISTORY: Family History  Problem Relation Age of Onset  . Breast cancer Mother 44  . Kidney Stones Father     SOCIAL HISTORY:  Social History   Socioeconomic History  . Marital status: Married    Spouse name: Not on file  . Number of children: Not on file  . Years of education: Not on file  . Highest education level: Not on file  Occupational History  . Not on file  Tobacco Use  . Smoking status: Never Smoker  . Smokeless tobacco: Never Used  Substance and Sexual Activity  . Alcohol use: Yes    Comment: wine sometimes   . Drug use: No  . Sexual activity: Not on file  Other Topics Concern  . Not on file  Social History Narrative   Right handed   Coffee every morning   Lives with husband, Herbie [place].   Social Determinants of Health   Financial Resource Strain: Not on file  Food Insecurity: Not on file  Transportation Needs: Not on file  Physical Activity: Not on file  Stress: Not on file  Social Connections: Not on file  Intimate Partner Violence: Not on file     PHYSICAL EXAM  Vitals:   11/09/20 1024  BP: 120/80  Pulse: 93  Weight: 117 lb (53.1 kg)  Height: 5' 2.5" (1.588 m)    Body mass index is 21.06 kg/m.   General: The patient is well-developed and well-nourished and in no acute distress  HEENT:  Head is [place]/AT.  Sclera are anicteric.     Neck: No carotid bruits are noted.  The neck is nontender.  Cardiovascular: The heart has a regular rate and rhythm with a normal S1 and S2. There were no murmurs, gallops or rubs.    Skin: Extremities are without rash or  edema.  Musculoskeletal:  Back is nontender  Neurologic Exam  Mental status: The patient is alert and  oriented x 3 at the time of the examination.  She has reduced short-term memory but performed well on other cognitive tasks.  The [place] cognitive assessment score was 22/30.  Details are above.  Speech is normal.  Cranial nerves: Extraocular movements are full. Pupils are equal, round, and reactive to light and accomodation.  Visual fields are full.  Facial symmetry is present. There is good facial sensation to soft touch bilaterally.Facial strength is normal.  Trapezius and sternocleidomastoid strength is normal. No dysarthria is noted.  The tongue is midline, and the patient has symmetric elevation of the soft palate. Mild hearing deficits are noted.  Motor:  Muscle bulk is normal.   Tone is normal. Strength is  5 / 5 in all 4 extremities.  Sensory: Sensory testing is intact to pinprick, soft touch and vibration sensation in all 4 extremities.  Coordination: Cerebellar testing reveals good finger-nose-finger and heel-to-shin bilaterally.  Gait and station: Station is normal.   Gait is normal. Tandem gait is normal. Romberg is negative.   Reflexes: Deep tendon reflexes are symmetric and normal bilaterally.   Plantar responses are flexor.    DIAGNOSTIC DATA (LABS, IMAGING, TESTING) - I reviewed patient records, labs, notes, testing and imaging myself where available.  Lab Results  Component Value Date   WBC 4.5 08/23/2016   HGB 10.9 (L) 08/23/2016   HCT 33.3 (L) 08/23/2016   MCV 92.5 08/23/2016   PLT 118 (L) 08/23/2016      Component Value Date/Time   NA 139 08/23/2016 0518   K 3.3 (L) 08/23/2016 0518   CL 106 08/23/2016 0518   CO2 26 08/23/2016 0518   GLUCOSE 94 08/23/2016 0518   BUN 15 08/23/2016 0518   CREATININE 0.74 08/23/2016 0518   CALCIUM 8.2 (L) 08/23/2016 0518   PROT 5.6 (L) 08/23/2016 0518   ALBUMIN 3.4 (L) 08/23/2016 0518   AST 62 (H) 08/23/2016 0518   ALT 41 08/23/2016 0518   ALKPHOS 31 (L) 08/23/2016 0518   BILITOT 0.7 08/23/2016 0518   GFRNONAA >60  08/23/2016 0518   GFRAA >60 08/23/2016 0518       ASSESSMENT AND PLAN  Memory loss - Plan: Vitamin B12, TSH, VITAMIN D 25 Hydroxy (Vit-D Deficiency, Fractures), MR BRAIN WO CONTRAST  Vitamin D deficiency - Plan: VITAMIN D 25 Hydroxy (Vit-D Deficiency, Fractures)   In summary, Ms. Bula is an 86 year old woman who has been noted by her family to have progressive cognitive decline over the last couple of years.  She does not feel that she is having any difficulty.  She scored 22/30 today consistent with fairly mild dementia.  She lost 5 points for short-term recall.  This pattern is most concerning for Alzheimer's.  We need to check an MRI of the brain to determine the extent and distribution of the atrophy as well as to assess for other issues (multi-infarct, subdural hematomas, hydrocephalus) that could affect cognition.  Additionally we will check some blood work for reversible causes of dementia.  She is already on donepezil and memantine and tolerates them well.  She is advised to continue.  She will return to see me in 5 to 6 months or sooner for new or worsening neurologic symptoms.  Thank you for asking me to see Ms. Owens Shark for a neurologic consultation.  Please let me know if I can be of further assistance with her or other patients in the future.   Donnavin Vandenbrink A. Felecia Shelling, MD, [facility] 10/04/5398, 86:76 AM Certified in Neurology, Clinical Neurophysiology, Sleep Medicine and Neuroimaging  [facility] [address] [place], [place] [address] [phone]

## 2020-11-10 LAB — TSH: TSH: 1.11 u[IU]/mL (ref 0.450–4.500)

## 2020-11-10 LAB — VITAMIN D 25 HYDROXY (VIT D DEFICIENCY, FRACTURES): Vit D, 25-Hydroxy: 25.4 ng/mL — ABNORMAL LOW (ref 30.0–100.0)

## 2020-11-10 LAB — VITAMIN B12: Vitamin B-12: 430 pg/mL (ref 232–1245)

## 2020-11-11 ENCOUNTER — Other Ambulatory Visit: Payer: Self-pay

## 2020-11-11 ENCOUNTER — Ambulatory Visit
Admission: RE | Admit: 2020-11-11 | Discharge: 2020-11-11 | Disposition: A | Discharge status: Home / Self Care | Payer: Medicare Other | Source: Ambulatory Visit | Attending: Neurology | Admitting: Neurology

## 2020-11-11 DIAGNOSIS — R413 Other amnesia: Secondary | ICD-10-CM

## 2020-11-14 ENCOUNTER — Telehealth: Payer: Self-pay | Admitting: *Deleted

## 2020-11-14 NOTE — Telephone Encounter (Signed)
Called and spoke w/ pt/daughter about MRI results per Dr. Garth Bigness note. They verbalized understanding.

## 2020-11-14 NOTE — Telephone Encounter (Signed)
-----   Message from Britt Bottom, MD sent at 11/14/2020  5:03 PM EDT ----- Please let the family know that the MRI showed some atrophy and other age-related changes.  Nothing looked new.  Atrophy can be seen with Alzheimer's disease.  I would like her to continue to take the donepezil and memantine.

## 2021-05-11 ENCOUNTER — Ambulatory Visit: Payer: Medicare Other | Admitting: Neurology

## 2021-05-11 ENCOUNTER — Other Ambulatory Visit: Payer: Self-pay

## 2021-05-11 ENCOUNTER — Encounter: Payer: Self-pay | Admitting: Neurology

## 2021-05-11 VITALS — BP 167/75 | HR 67 | Ht 62.5 in | Wt 118.0 lb

## 2021-05-11 DIAGNOSIS — F02A0 Dementia in other diseases classified elsewhere, mild, without behavioral disturbance, psychotic disturbance, mood disturbance, and anxiety: Secondary | ICD-10-CM

## 2021-05-11 DIAGNOSIS — R413 Other amnesia: Secondary | ICD-10-CM | POA: Diagnosis not present

## 2021-05-11 DIAGNOSIS — G301 Alzheimer's disease with late onset: Secondary | ICD-10-CM | POA: Diagnosis not present

## 2021-05-11 NOTE — Progress Notes (Signed)
[facility]  PATIENT: Tracy Bradford DOB: [birth year]  REFERRING DOCTOR OR PCP: Horald Pollen, MD SOURCE: Patient, notes from PCP  _________________________________   HISTORICAL  CHIEF COMPLAINT:  Chief Complaint  Patient presents with   Follow-up    RM 1, with husband. Last MOCA 22/30.  Today's MOCA: 23/30.    HISTORY OF PRESENT ILLNESS:  ,Tracy Bradford is a 86 y.o. woman memory loss and other cognitive concerns.  Update 05/11/2021 Since the last visit she has had an MRI of the brain.  It does show mild chronic microvascular ischemic change and atrophy that is a little more pronounced in the medial temporal lobes.  The MRI, combined with her performance on the [facility] cognitive assessment is consistent with mild Alzheimer's disease.  She feels memory is fine and that she is not forgetful.  She states she is doing her checkbook and taxes.   She states her driving is fine.  I did discuss with her and her husband that I felt that she should not drive.    At the last visit, her daughter Mickel Baas notes she has has STM issues.   Ms. Minton repeats her conversations.   She is forgetful.  She often does not remember what she needs to do.  Mickel Baas helps her mom with financial tasks.    She makes sure her mpm's checks are mailed.    She notes her mom is not driving as much.    Ms. Easler husband will do longer driving but Gabrianna does some short trips.      Ms. Semrad sleeps well most nights (10 pm - 6:30 am).    She does not snore.   She denies depression or anxiety.  She is in excellent health and is physically active.  She has been on donepezil and memantine.  They did not notice a benefit though she does appear to have been stable with this year.  Physically she is doing well and walks a mile daily.   She does some yardwork.  She eats healthy.    MRI Brain 11/11/2020: : This MRI of the brain without contrast shows the following: 1.   Mild generalized cortical atrophy. 2.    Scattered T2/FLAIR hyperintense foci in the hemispheres and pons consistent with mild age-related chronic microvascular ischemic change. 3.   No acute findings.    Montreal Cognitive Assessment  05/11/2021 11/09/2020  Visuospatial/ Executive (0/5) 5 5  Naming (0/3) 3 2  Attention: Read list of digits (0/2) 2 1  Attention: Read list of letters (0/1) 1 1  Attention: Serial 7 subtraction starting at 100 (0/3) 3 3  Language: Repeat phrase (0/2) 0 1  Language : Fluency (0/1) 1 1  Abstraction (0/2) 2 2  Delayed Recall (0/5) 0 0  Orientation (0/6) 6 6  Total 23 22  Adjusted Score (based on education) 23 22    She is a Forensic psychologist in Social research officer, government.    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    She spends her day knitting, gardening and reading.  She also tries to walk daily.   She watches the grand kids  REVIEW OF SYSTEMS: Constitutional: No fevers, chills, sweats, or change in appetite Eyes: No visual changes, double vision, eye pain Ear, nose and throat: No hearing loss, ear pain, nasal congestion, sore throat Cardiovascular: No chest pain, palpitations Respiratory:  No shortness of breath at rest or with exertion.   No wheezes GastrointestinaI: No nausea, vomiting, diarrhea,  abdominal pain, fecal incontinence Genitourinary:  No dysuria, urinary retention or frequency.  No nocturia. Musculoskeletal:  No neck pain, back pain Integumentary: No rash, pruritus, skin lesions Neurological: as above Psychiatric: No depression at this time.  No anxiety Endocrine: No palpitations, diaphoresis, change in appetite, change in weigh or increased thirst Hematologic/Lymphatic:  No anemia, purpura, petechiae. Allergic/Immunologic: No itchy/runny eyes, nasal congestion, recent allergic reactions, rashes  ALLERGIES: Allergies  Allergen Reactions   Lemon Flavor Nausea And Vomiting    Lemons     HOME MEDICATIONS:  Current Outpatient Medications:    divalproex (DEPAKOTE) 125 MG DR tablet,  Take 125 mg by mouth daily., Disp: , Rfl:    donepezil (ARICEPT) 10 MG tablet, Take 10 mg by mouth at bedtime., Disp: , Rfl:    lisinopril-hydrochlorothiazide (ZESTORETIC) 10-12.5 MG tablet, Take 1 tablet by mouth daily. Take 1 tab/day and 1/2 tab po qhs, Disp: , Rfl:    memantine (NAMENDA) 10 MG tablet, Take 10 mg by mouth 2 (two) times daily., Disp: , Rfl:   PAST MEDICAL HISTORY: Past Medical History:  Diagnosis Date   Kidney stones     PAST SURGICAL HISTORY: Past Surgical History:  Procedure Laterality Date   kidney stone removal     ROBOTIC ASSISTED BILATERAL SALPINGO OOPHERECTOMY Bilateral 04/13/2013   Procedure: ROBOTIC ASSISTED BILATERAL SALPINGO OOPHORECTOMY;Lysis of Bowel adhesions,myomectomy, removal of endocervical polyp;  Surgeon: Lovenia Kim, MD;  Location: [facility];  Service: Gynecology;  Laterality: Bilateral;   TONSILLECTOMY      FAMILY HISTORY: Family History  Problem Relation Age of Onset   Breast cancer Mother 33   Kidney Stones Father     SOCIAL HISTORY:  Social History   Socioeconomic History   Marital status: Married    Spouse name: Not on file   Number of children: Not on file   Years of education: Not on file   Highest education level: Not on file  Occupational History   Not on file  Tobacco Use   Smoking status: Never   Smokeless tobacco: Never  Substance and Sexual Activity   Alcohol use: Yes    Comment: wine sometimes    Drug use: No   Sexual activity: Not on file  Other Topics Concern   Not on file  Social History Narrative   Right handed   Coffee every morning   Lives with husband, Herbie [place].   Social Determinants of Health   Financial Resource Strain: Not on file  Food Insecurity: Not on file  Transportation Needs: Not on file  Physical Activity: Not on file  Stress: Not on file  Social Connections: Not on file  Intimate Partner Violence: Not on file     PHYSICAL EXAM  Vitals:   05/11/21 0928  BP: (!) 167/75  Pulse: 67   Weight: 118 lb (53.5 kg)  Height: 5' 2.5" (1.588 m)    Body mass index is 21.24 kg/m.   General: The patient is well-developed and well-nourished and in no acute distress  HEENT:  Head is [place]/AT.  Sclera are anicteric.     Neck: No carotid bruits are noted.  The neck is nontender.  Cardiovascular: The heart has a regular rate and rhythm with a normal S1 and S2. There were no murmurs, gallops or rubs.    Skin: Extremities are without rash or  edema.  Musculoskeletal:  Back is nontender  Neurologic Exam  Mental status: The patient is alert and oriented x 3 at the time of the  examination.  The [facility] cognitive assessment score was 23/30 and was 22/30 at the last visit.  She is college-educated.  Details are above.  Speech is normal.  Cranial nerves: Extraocular movements are full.  Patient strength and sensation is normal.  Mild hearing deficits are noted.  Motor:  Muscle bulk is normal.   Tone is normal. Strength is  5 / 5 in all 4 extremities.   Sensory: Sensory testing is intact to touch.  Coordination: Cerebellar testing reveals good finger-nose-finger and heel-to-shin bilaterally.  Gait and station: Station is normal.   Gait is normal. Tandem gait is normal for age. Romberg is negative.   Reflexes: Deep tendon reflexes are symmetric and normal bilaterally.       DIAGNOSTIC DATA (LABS, IMAGING, TESTING) - I reviewed patient records, labs, notes, testing and imaging myself where available.  Lab Results  Component Value Date   WBC 4.5 08/23/2016   HGB 10.9 (L) 08/23/2016   HCT 33.3 (L) 08/23/2016   MCV 92.5 08/23/2016   PLT 118 (L) 08/23/2016      Component Value Date/Time   NA 139 08/23/2016 0518   K 3.3 (L) 08/23/2016 0518   CL 106 08/23/2016 0518   CO2 26 08/23/2016 0518   GLUCOSE 94 08/23/2016 0518   BUN 15 08/23/2016 0518   CREATININE 0.74 08/23/2016 0518   CALCIUM 8.2 (L) 08/23/2016 0518   PROT 5.6 (L) 08/23/2016 0518   ALBUMIN 3.4 (L) 08/23/2016  0518   AST 62 (H) 08/23/2016 0518   ALT 41 08/23/2016 0518   ALKPHOS 31 (L) 08/23/2016 0518   BILITOT 0.7 08/23/2016 0518   GFRNONAA >60 08/23/2016 0518   GFRAA >60 08/23/2016 0518       ASSESSMENT AND PLAN  Mild late onset Alzheimer's dementia without behavioral disturbance, psychotic disturbance, mood disturbance, or anxiety (HCC)  Memory loss  Continue donepezil and memantine. 2.   Exercise regularly and eat healthy.  We discussed that heart healthy diet generally brain healthy as well. 3.   Although she is able to drive, I am concerned that judgment in higher stress driving situations could be impaired based on the diagnosis.  Therefore, I recommend that she not drive 4.   She will return as needed if there are significant new or worsening neurologic symptoms.  Marceline Napierala A. Felecia Shelling, MD, [facility] 29/04/2425, 83:41 AM Certified in Neurology, Clinical Neurophysiology, Sleep Medicine and Neuroimaging  [facility] [address] [place], [place] [address] [phone]

## 2022-01-12 ENCOUNTER — Other Ambulatory Visit: Payer: Self-pay | Admitting: Family Medicine

## 2022-01-12 DIAGNOSIS — Z1231 Encounter for screening mammogram for malignant neoplasm of breast: Secondary | ICD-10-CM

## 2022-01-12 DIAGNOSIS — M810 Age-related osteoporosis without current pathological fracture: Secondary | ICD-10-CM

## 2022-01-30 ENCOUNTER — Ambulatory Visit
Admission: RE | Admit: 2022-01-30 | Discharge: 2022-01-30 | Disposition: A | Discharge status: Home / Self Care | Payer: Medicare Other | Source: Ambulatory Visit | Attending: Family Medicine | Admitting: Family Medicine

## 2022-01-30 DIAGNOSIS — Z1231 Encounter for screening mammogram for malignant neoplasm of breast: Secondary | ICD-10-CM

## 2022-02-01 ENCOUNTER — Other Ambulatory Visit: Payer: Self-pay | Admitting: Family Medicine

## 2022-02-01 DIAGNOSIS — R928 Other abnormal and inconclusive findings on diagnostic imaging of breast: Secondary | ICD-10-CM

## 2022-02-07 ENCOUNTER — Other Ambulatory Visit: Payer: Self-pay | Admitting: Family Medicine

## 2022-02-09 ENCOUNTER — Other Ambulatory Visit: Payer: Self-pay | Admitting: Family Medicine

## 2022-02-09 DIAGNOSIS — R928 Other abnormal and inconclusive findings on diagnostic imaging of breast: Secondary | ICD-10-CM

## 2022-02-12 ENCOUNTER — Other Ambulatory Visit: Payer: Self-pay | Admitting: Family Medicine

## 2022-02-12 ENCOUNTER — Ambulatory Visit
Admission: RE | Admit: 2022-02-12 | Discharge: 2022-02-12 | Disposition: A | Discharge status: Home / Self Care | Payer: Medicare Other | Source: Ambulatory Visit | Attending: Family Medicine | Admitting: Family Medicine

## 2022-02-12 DIAGNOSIS — R928 Other abnormal and inconclusive findings on diagnostic imaging of breast: Secondary | ICD-10-CM

## 2022-02-12 DIAGNOSIS — N6320 Unspecified lump in the left breast, unspecified quadrant: Secondary | ICD-10-CM

## 2022-02-15 ENCOUNTER — Ambulatory Visit
Admission: RE | Admit: 2022-02-15 | Discharge: 2022-02-15 | Disposition: A | Discharge status: Home / Self Care | Payer: Medicare Other | Source: Ambulatory Visit | Attending: Family Medicine | Admitting: Family Medicine

## 2022-02-15 DIAGNOSIS — N6320 Unspecified lump in the left breast, unspecified quadrant: Secondary | ICD-10-CM

## 2022-06-26 ENCOUNTER — Ambulatory Visit
Admission: RE | Admit: 2022-06-26 | Discharge: 2022-06-26 | Disposition: A | Discharge status: Home / Self Care | Payer: Medicare Other | Source: Ambulatory Visit | Attending: Family Medicine | Admitting: Family Medicine

## 2022-06-26 DIAGNOSIS — M810 Age-related osteoporosis without current pathological fracture: Secondary | ICD-10-CM

## 2022-06-29 ENCOUNTER — Other Ambulatory Visit: Payer: Self-pay

## 2022-06-29 ENCOUNTER — Emergency Department (HOSPITAL_COMMUNITY): Payer: Medicare Other

## 2022-06-29 ENCOUNTER — Emergency Department (HOSPITAL_COMMUNITY)
Admission: EM | Admit: 2022-06-29 | Discharge: 2022-06-29 | Disposition: A | Discharge status: Home / Self Care | Payer: Medicare Other | Attending: Emergency Medicine | Admitting: Emergency Medicine

## 2022-06-29 DIAGNOSIS — S60410A Abrasion of right index finger, initial encounter: Secondary | ICD-10-CM | POA: Insufficient documentation

## 2022-06-29 DIAGNOSIS — F0390 Unspecified dementia without behavioral disturbance: Secondary | ICD-10-CM | POA: Insufficient documentation

## 2022-06-29 DIAGNOSIS — W19XXXA Unspecified fall, initial encounter: Secondary | ICD-10-CM

## 2022-06-29 DIAGNOSIS — Z23 Encounter for immunization: Secondary | ICD-10-CM | POA: Insufficient documentation

## 2022-06-29 DIAGNOSIS — S0101XA Laceration without foreign body of scalp, initial encounter: Secondary | ICD-10-CM | POA: Diagnosis not present

## 2022-06-29 DIAGNOSIS — Y92003 Bedroom of unspecified non-institutional (private) residence as the place of occurrence of the external cause: Secondary | ICD-10-CM | POA: Insufficient documentation

## 2022-06-29 DIAGNOSIS — W01198A Fall on same level from slipping, tripping and stumbling with subsequent striking against other object, initial encounter: Secondary | ICD-10-CM | POA: Insufficient documentation

## 2022-06-29 DIAGNOSIS — Y9301 Activity, walking, marching and hiking: Secondary | ICD-10-CM | POA: Insufficient documentation

## 2022-06-29 DIAGNOSIS — S0990XA Unspecified injury of head, initial encounter: Secondary | ICD-10-CM | POA: Diagnosis present

## 2022-06-29 MED ORDER — LIDOCAINE HCL (PF) 1 % IJ SOLN
5.0000 mL | Freq: Once | INTRAMUSCULAR | Status: AC
  Administered 2022-06-29: 5 mL
  Filled 2022-06-29: qty 30

## 2022-06-29 MED ORDER — TETANUS-DIPHTH-ACELL PERTUSSIS 5-2.5-18.5 LF-MCG/0.5 IM SUSY
0.5000 mL | PREFILLED_SYRINGE | Freq: Once | INTRAMUSCULAR | Status: AC
  Administered 2022-06-29: 0.5 mL via INTRAMUSCULAR
  Filled 2022-06-29: qty 0.5

## 2022-06-29 MED ORDER — LIDOCAINE-EPINEPHRINE (PF) 2 %-1:200000 IJ SOLN
INTRAMUSCULAR | Status: AC
  Filled 2022-06-29: qty 20

## 2022-06-29 MED ORDER — BACITRACIN ZINC 500 UNIT/GM EX OINT
TOPICAL_OINTMENT | Freq: Once | CUTANEOUS | Status: DC
Start: 2022-06-29 — End: 2022-06-29

## 2022-06-29 NOTE — ED Provider Triage Note (Signed)
Emergency Medicine Provider Triage Evaluation Note  SHURONDA SANTINO , a 87 y.o. female  was evaluated in triage.  Pt complains of head injury after mechanical fall that was witnessed by husband. She hit head on corner of door. No LOC. Larger laceration present to right forehead. Not on blood thinners. She has had Tdap in past 5 years.  Review of Systems  Positive:  Negative:   Physical Exam  BP (!) 177/86   Pulse 68   Temp 97.7 F (36.5 C) (Oral)   Resp 16   SpO2 100%  Gen:   Awake, no distress   Resp:  Normal effort  MSK:   Moves extremities without difficulty  Other:  Gaping laceration to right forehead.  Medical Decision Making  Medically screening exam initiated at 6:04 AM.  Appropriate orders placed.  Rosalita Chessman was informed that the remainder of the evaluation will be completed by another provider, this initial triage assessment does not replace that evaluation, and the importance of remaining in the ED until their evaluation is complete.     Adolphus Birchwood, [place] 06/29/22 [phone]

## 2022-06-29 NOTE — ED Provider Notes (Signed)
[facility] DEPT Provider Note   CSN: [id number] Arrival date & time: 06/29/22  0533     History  Chief Complaint  Patient presents with   Head Injury    Tracy Bradford is a 87 y.o. female.  With past medical history of dementia who presents to the emergency department with fall.  Presents with her husband.  She states that she woke up around 3:30 in the morning needing to go to the restroom.  She states that she got out of bed, struck her toe on the bed frame, falling forward and striking her head on the bathroom door frame.  She denies losing consciousness.  Not anticoagulated.  Husband states that he was there and witnessed this event.  He also denies her having loss of consciousness. She denies headache, neck pain, visual changes, pain to her extremities or hips. Denies chest pain, shortness of breath, palpitations, dizziness. Denies prodromal symptoms of syncope.    Head Injury      Home Medications Prior to Admission medications   Medication Sig Start Date End Date Taking? Authorizing Provider  divalproex (DEPAKOTE) 125 MG DR tablet Take 125 mg by mouth daily.    [provider]  donepezil (ARICEPT) 10 MG tablet Take 10 mg by mouth at bedtime.    [provider]  lisinopril-hydrochlorothiazide (ZESTORETIC) 10-12.5 MG tablet Take 1 tablet by mouth daily. Take 1 tab/day and 1/2 tab po qhs    [provider]  memantine (NAMENDA) 10 MG tablet Take 10 mg by mouth 2 (two) times daily.    [provider]      Allergies    Lemon flavor    Review of Systems   Review of Systems  Skin:  Positive for wound.  All other systems reviewed and are negative.   Physical Exam Updated Vital Signs BP (!) 168/58   Pulse 69   Temp 97.7 F (36.5 C) (Oral)   Resp 16   SpO2 97%  Physical Exam Vitals and nursing note reviewed.  Constitutional:      General: She is not in acute distress.    Appearance: Normal appearance.  She is normal weight. She is not ill-appearing or toxic-appearing.  HENT:     Head: Normocephalic.     Nose: Nose normal.     Mouth/Throat:     Mouth: Mucous membranes are moist.     Pharynx: Oropharynx is clear.  Eyes:     General: No scleral icterus.    Extraocular Movements: Extraocular movements intact.     Pupils: Pupils are equal, round, and reactive to light.  Cardiovascular:     Rate and Rhythm: Normal rate and regular rhythm.     Pulses: Normal pulses.     Heart sounds: No murmur heard. Pulmonary:     Effort: Pulmonary effort is normal. No respiratory distress.     Breath sounds: Normal breath sounds.  Abdominal:     General: Bowel sounds are normal. There is no distension.     Palpations: Abdomen is soft.     Tenderness: There is no abdominal tenderness.  Musculoskeletal:        General: Normal range of motion.     Cervical back: Normal range of motion and neck supple. No tenderness.     Comments: Small abrasion to the right index finger without deformity or pain Pelvis is stable  Moves all extremities well   Skin:    General: Skin is warm and dry.  Capillary Refill: Capillary refill takes less than 2 seconds.  Neurological:     General: No focal deficit present.     Mental Status: She is alert. Mental status is at baseline.     Comments: Baseline mental status per husband   Psychiatric:        Mood and Affect: Mood normal.        Behavior: Behavior normal.        Thought Content: Thought content normal.        Judgment: Judgment normal.     ED Results / Procedures / Treatments   Labs (all labs ordered are listed, but only abnormal results are displayed) Labs Reviewed - No data to display  EKG None  Radiology CT Head Wo Contrast  Result Date: 06/29/2022 CLINICAL DATA:  Trauma.  Fall.  Laceration to forehead. EXAM: CT HEAD WITHOUT CONTRAST CT CERVICAL SPINE WITHOUT CONTRAST TECHNIQUE: Multidetector CT imaging of the head and cervical spine was  performed following the standard protocol without intravenous contrast. Multiplanar CT image reconstructions of the cervical spine were also generated. RADIATION DOSE REDUCTION: This exam was performed according to the departmental dose-optimization program which includes automated exposure control, adjustment of the mA and/or kV according to patient size and/or use of iterative reconstruction technique. COMPARISON:  None Available. FINDINGS: CT HEAD FINDINGS Brain: No evidence of acute infarction, hemorrhage, hydrocephalus, extra-axial collection or mass lesion/mass effect. Vascular: No hyperdense vessel or unexpected calcification. Skull: Normal. Negative for fracture or focal lesion. Sinuses/Orbits: Paranasal sinuses and mastoid air cells are clear. Other: Right frontal scalp laceration is identified, image 17/2. CT CERVICAL SPINE FINDINGS Alignment: No acute posttraumatic malalignment. Skull base and vertebrae: No acute fracture. No primary bone lesion or focal pathologic process. Soft tissues and spinal canal: No prevertebral fluid or swelling. No visible canal hematoma. Disc levels: There is disc space narrowing and endplate spurring identified throughout the cervical spine. This is most advanced at C5-6. Upper chest: Negative. Other: None IMPRESSION: 1. No evidence for acute intracranial abnormality. 2. Right frontal scalp laceration. 3. No evidence for cervical spine fracture or subluxation. 4. Cervical degenerative disc disease. Electronically Signed   By: Kerby Moors M.D.   On: 06/29/2022 06:23   CT Cervical Spine Wo Contrast  Result Date: 06/29/2022 CLINICAL DATA:  Trauma.  Fall.  Laceration to forehead. EXAM: CT HEAD WITHOUT CONTRAST CT CERVICAL SPINE WITHOUT CONTRAST TECHNIQUE: Multidetector CT imaging of the head and cervical spine was performed following the standard protocol without intravenous contrast. Multiplanar CT image reconstructions of the cervical spine were also generated. RADIATION  DOSE REDUCTION: This exam was performed according to the departmental dose-optimization program which includes automated exposure control, adjustment of the mA and/or kV according to patient size and/or use of iterative reconstruction technique. COMPARISON:  None Available. FINDINGS: CT HEAD FINDINGS Brain: No evidence of acute infarction, hemorrhage, hydrocephalus, extra-axial collection or mass lesion/mass effect. Vascular: No hyperdense vessel or unexpected calcification. Skull: Normal. Negative for fracture or focal lesion. Sinuses/Orbits: Paranasal sinuses and mastoid air cells are clear. Other: Right frontal scalp laceration is identified, image 17/2. CT CERVICAL SPINE FINDINGS Alignment: No acute posttraumatic malalignment. Skull base and vertebrae: No acute fracture. No primary bone lesion or focal pathologic process. Soft tissues and spinal canal: No prevertebral fluid or swelling. No visible canal hematoma. Disc levels: There is disc space narrowing and endplate spurring identified throughout the cervical spine. This is most advanced at C5-6. Upper chest: Negative. Other: None IMPRESSION: 1. No evidence for  acute intracranial abnormality. 2. Right frontal scalp laceration. 3. No evidence for cervical spine fracture or subluxation. 4. Cervical degenerative disc disease. Electronically Signed   By: Kerby Moors M.D.   On: 06/29/2022 06:23    Procedures .Marland KitchenLaceration Repair  Date/Time: 06/29/2022 8:29 AM  Performed by: Mickie Hillier, PA-C Authorized by: Mickie Hillier, PA-C   Consent:    Consent obtained:  Verbal   Consent given by:  Patient and spouse   Risks, benefits, and alternatives were discussed: yes     Risks discussed:  Infection, pain, retained foreign body, need for additional repair, poor cosmetic result, tendon damage, nerve damage, poor wound healing and vascular damage   Alternatives discussed:  No treatment Universal protocol:    Procedure explained and questions answered  to patient or proxy's satisfaction: yes     Relevant documents present and verified: yes     Test results available: yes     Imaging studies available: yes     Required blood products, implants, devices, and special equipment available: yes     Site/side marked: yes     Immediately prior to procedure, a time out was called: yes     Patient identity confirmed:  Verbally with patient Anesthesia:    Anesthesia method:  Local infiltration   Local anesthetic:  Lidocaine 1% WITH epi Laceration details:    Location:  Scalp   Scalp location:  Frontal   Length (cm):  3.5 Pre-procedure details:    Preparation:  Patient was prepped and draped in usual sterile fashion and imaging obtained to evaluate for foreign bodies Exploration:    Limited defect created (wound extended): no     Hemostasis achieved with:  Epinephrine   Imaging obtained comment:  CT   Imaging outcome: foreign body not noted     Wound exploration: wound explored through full range of motion and entire depth of wound visualized     Wound extent: areolar tissue violated, fascia violated and muscle damage     Wound extent: no foreign body     Contaminated: no   Treatment:    Area cleansed with:  Povidone-iodine   Amount of cleaning:  Standard   Irrigation solution:  Sterile saline   Irrigation volume:  500   Irrigation method:  Pressure wash   Debridement:  None   Undermining:  Extensive   Scar revision: no     Layers/structures repaired:  Muscle fascia Muscle fascia:    Suture size:  4-0   Suture material:  Vicryl   Suture technique:  Simple interrupted   Number of sutures:  2 Skin repair:    Repair method:  Sutures   Suture size:  5-0   Suture material:  Prolene   Suture technique:  Simple interrupted   Number of sutures:  8 Approximation:    Approximation:  Close Repair type:    Repair type:  Complex Post-procedure details:    Dressing:  Antibiotic ointment and non-adherent dressing   Procedure completion:   Tolerated well, no immediate complications    Medications Ordered in ED Medications  bacitracin ointment (has no administration in time range)  lidocaine (PF) (XYLOCAINE) 1 % injection 5 mL (5 mLs Infiltration Given by Other 06/29/22 0715)  Tdap (BOOSTRIX) injection 0.5 mL (0.5 mLs Intramuscular Given 06/29/22 0716)  lidocaine-EPINEPHrine (XYLOCAINE W/EPI) 2 %-1:200000 (PF) injection (  Given by Other 06/29/22 0258)    ED Course/ Medical Decision Making/ A&P  Medical Decision Making Amount and/or Complexity of Data Reviewed Radiology: ordered.  Risk OTC drugs. Prescription drug management.  Initial Impression and Ddx 87 year old female who presents to the emergency department after trip and fall at home.  She did strike her head on the door frame.  She has a 3 cm laceration to the right forehead that extends to the skull.  Bleeding is controlled.  No crepitus.  No tenderness to palpation of the C-spine.  The remainder of her physical exam is unremarkable. Patient PMH that increases complexity of ED encounter: Dementia  Interpretation of Diagnostics I independent reviewed and interpreted the labs as followed: Not indicated  - I independently visualized the following imaging with scope of interpretation limited to determining acute life threatening conditions related to emergency care: CT head and C-spine, which revealed no acute abnormalities  Patient Reassessment and Ultimate Disposition/Management Patient is at baseline mental status per husband at bedside given her history of dementia.  Do not appreciate any focal neurological deficits.  There is no C-spine tenderness or other musculoskeletal injury secondary to her fall. Do not feel that her fall was related to some underlying cardiovascular event.  She had no prodromal symptoms.  History is consistent with a mechanical fall.  Do not feel that she needs any further imaging.  She had a CT head and C-spine  which were both negative for acute abnormalities.  She did require a complex suture closure of the forehead.  There were 2 internal sutures placed to pull the muscle back together and then 8 skin closure sutures.  Discussed laceration care at home with husband.  Also discussed following up with PCP, urgent care, emergency department for suture removal in 7 days.   Given return precautions for worsening mental status.  Otherwise feel that she is safe for discharge at this time.  Patient management required discussion with the following services or consulting groups:  None  Complexity of Problems Addressed Acute complicated illness or Injury  Additional Data Reviewed and Analyzed Further history obtained from: Further history from spouse/family member and Care Everywhere  Patient Encounter Risk Assessment Minor Procedures  Final Clinical Impression(s) / ED Diagnoses Final diagnoses:  Fall, initial encounter  Laceration of scalp without foreign body, initial encounter    Rx / DC Orders ED Discharge Orders     None         Mickie Hillier, PA-C 06/29/22 0840    Varney Biles, MD 06/30/22 1425

## 2022-06-29 NOTE — ED Triage Notes (Signed)
Patient coming to ED for evaluation of laceration to forehead s/p fall.  Reports she was walking to bathroom after getting out of bed and hit her toe.  [place] forward and hit her head on the door frame.  Large laceration noted.  Bleeding controlled.  Patient reports no LOC.  Does not take blood thinners.  Alert at this time.  Husband was present and witnessed fall.

## 2022-06-29 NOTE — Discharge Instructions (Signed)
You were seen in the emergency department today after a trip and fall.  We did a CT scan of your head and neck which did not show any acute injuries.  You did have a large laceration to your forehead that we closed.  You will need to present back to your primary care, urgent care or emergency department in 7 days to have the sutures removed.  I have attached laceration care instructions your discharge paperwork.  You may also be helpful to use some A&D ointment over the wound to prevent reddening of the scar.  Please return to the emergency department for any change in behavior or worsening confusion.

## 2024-03-05 ENCOUNTER — Other Ambulatory Visit: Payer: Self-pay | Admitting: Family Medicine

## 2024-03-05 ENCOUNTER — Ambulatory Visit
Admission: RE | Admit: 2024-03-05 | Discharge: 2024-03-05 | Disposition: A | Discharge status: Home / Self Care | Source: Ambulatory Visit | Attending: Family Medicine | Admitting: Family Medicine

## 2024-03-05 DIAGNOSIS — R0609 Other forms of dyspnea: Secondary | ICD-10-CM

## 2024-06-01 ENCOUNTER — Encounter (HOSPITAL_BASED_OUTPATIENT_CLINIC_OR_DEPARTMENT_OTHER): Payer: Self-pay

## 2024-06-03 ENCOUNTER — Ambulatory Visit (HOSPITAL_BASED_OUTPATIENT_CLINIC_OR_DEPARTMENT_OTHER): Admitting: Cardiology

## 2024-06-03 NOTE — Progress Notes (Incomplete)
  Cardiology Office Note:  .   Date:  06/03/2024  ID:  Jenkins VEAR Daring, DOB 1935-06-01, MRN [id number] PCP: Burney Darice CROME, MD  [facility] Providers Cardiologist:  None {  History of Present Illness: .   MAKEYLA GOVAN is a 89 y.o. female with PMH hypertension, CVA, dementia  Referral from 02/21/24 reviewed. Referred for dyspnea on exertion. Available documentation reviewed.  She had echo in 2021 with EF 60-65%, G2DD, normal RV function with mildly elevated PA pressure, mild MR, trivial AR, RAP 3.  ROS: Denies chest pain, shortness of breath at rest or with normal exertion. No PND, orthopnea, LE edema or unexpected weight gain. No syncope or palpitations. ROS otherwise negative except as noted.   Studies Reviewed: SABRA    EKG:       Physical Exam:   VS:  There were no vitals taken for this visit.   Wt Readings from Last 3 Encounters:  05/11/21 118 lb (53.5 kg)  11/09/20 117 lb (53.1 kg)  08/21/16 116 lb 10 oz (52.9 kg)    GEN: Well nourished, well developed in no acute distress HEENT: Normal, moist mucous membranes NECK: No JVD CARDIAC: regular rhythm, normal S1 and S2, no rubs or gallops. No murmur. VASCULAR: Radial and DP pulses 2+ bilaterally. No carotid bruits RESPIRATORY:  Clear to auscultation without rales, wheezing or rhonchi  ABDOMEN: Soft, non-tender, non-distended MUSCULOSKELETAL:  Ambulates independently SKIN: Warm and dry, no edema NEUROLOGIC:  Alert and oriented x 3. No focal neuro deficits noted. PSYCHIATRIC:  Normal affect    ASSESSMENT AND PLAN: .     CV risk counseling and prevention -recommend heart healthy/Mediterranean diet, with whole grains, fruits, vegetable, fish, lean meats, nuts, and olive oil. Limit salt. -recommend moderate walking, 3-5 times/week for 30-50 minutes each session. Aim for at least 150 minutes/week. Goal should be pace of 3 miles/hours, or walking 1.5 miles in 30 minutes -recommend avoidance of tobacco products. Avoid excess  alcohol. -ASCVD risk score: The ASCVD Risk score (Arnett DK, et al., 2019) failed to calculate for the following reasons:   The 2019 ASCVD risk score is only valid for ages 22 to 4    Dispo: ***  Signed, Shelda Bruckner, MD   Shelda Bruckner, MD, PhD, [facility]  [facility]  [place]  [facility] [address] [place], [place] [address] [phone]
